# Patient Record
Sex: Male | Born: 1935 | Race: White | Hispanic: No | Smoking: Never smoker
Health system: Southern US, Community
[De-identification: ages and names within clinical notes are randomized; demographics above are authoritative.]

## PROBLEM LIST (undated history)

## (undated) DIAGNOSIS — H9190 Unspecified hearing loss, unspecified ear: Secondary | ICD-10-CM

## (undated) DIAGNOSIS — C61 Malignant neoplasm of prostate: Secondary | ICD-10-CM

## (undated) DIAGNOSIS — F0390 Unspecified dementia without behavioral disturbance: Secondary | ICD-10-CM

## (undated) DIAGNOSIS — E7800 Pure hypercholesterolemia, unspecified: Secondary | ICD-10-CM

## (undated) DIAGNOSIS — R42 Dizziness and giddiness: Secondary | ICD-10-CM

## (undated) DIAGNOSIS — J189 Pneumonia, unspecified organism: Secondary | ICD-10-CM

## (undated) DIAGNOSIS — I2510 Atherosclerotic heart disease of native coronary artery without angina pectoris: Secondary | ICD-10-CM

## (undated) DIAGNOSIS — M1711 Unilateral primary osteoarthritis, right knee: Secondary | ICD-10-CM

## (undated) DIAGNOSIS — I639 Cerebral infarction, unspecified: Secondary | ICD-10-CM

## (undated) DIAGNOSIS — K219 Gastro-esophageal reflux disease without esophagitis: Secondary | ICD-10-CM

## (undated) HISTORY — PX: BREAST BIOPSY: SHX20

## (undated) HISTORY — PX: OTHER SURGICAL HISTORY: SHX169

## (undated) HISTORY — PX: APPENDECTOMY: SHX54

## (undated) HISTORY — PX: INGUINAL HERNIA REPAIR: SUR1180

## (undated) HISTORY — PX: TONSILLECTOMY: SUR1361

## (undated) HISTORY — DX: Unilateral primary osteoarthritis, right knee: M17.11

## (undated) HISTORY — DX: Pneumonia, unspecified organism: J18.9

## (undated) HISTORY — DX: Malignant neoplasm of prostate: C61

## (undated) HISTORY — DX: Unspecified hearing loss, unspecified ear: H91.90

---

## 1991-06-11 DIAGNOSIS — C61 Malignant neoplasm of prostate: Secondary | ICD-10-CM

## 1991-06-11 HISTORY — PX: PROSTATECTOMY: SHX69

## 1991-06-11 HISTORY — DX: Malignant neoplasm of prostate: C61

## 1993-08-08 DIAGNOSIS — J189 Pneumonia, unspecified organism: Secondary | ICD-10-CM

## 1993-08-08 HISTORY — DX: Pneumonia, unspecified organism: J18.9

## 1994-02-08 DIAGNOSIS — I2510 Atherosclerotic heart disease of native coronary artery without angina pectoris: Secondary | ICD-10-CM

## 1994-02-08 HISTORY — DX: Atherosclerotic heart disease of native coronary artery without angina pectoris: I25.10

## 2001-05-13 ENCOUNTER — Encounter: Payer: Self-pay | Admitting: Surgery

## 2001-05-13 ENCOUNTER — Encounter: Admission: RE | Admit: 2001-05-13 | Discharge: 2001-05-13 | Payer: Self-pay | Admitting: Surgery

## 2001-05-15 ENCOUNTER — Ambulatory Visit (HOSPITAL_BASED_OUTPATIENT_CLINIC_OR_DEPARTMENT_OTHER): Admission: RE | Admit: 2001-05-15 | Discharge: 2001-05-16 | Payer: Self-pay | Admitting: Surgery

## 2001-05-15 ENCOUNTER — Encounter (INDEPENDENT_AMBULATORY_CARE_PROVIDER_SITE_OTHER): Payer: Self-pay | Admitting: Specialist

## 2002-06-10 DIAGNOSIS — I639 Cerebral infarction, unspecified: Secondary | ICD-10-CM

## 2002-06-10 HISTORY — DX: Cerebral infarction, unspecified: I63.9

## 2003-01-17 ENCOUNTER — Encounter: Admission: RE | Admit: 2003-01-17 | Discharge: 2003-01-17 | Payer: Self-pay | Admitting: Internal Medicine

## 2003-01-17 ENCOUNTER — Encounter: Payer: Self-pay | Admitting: Internal Medicine

## 2003-01-21 ENCOUNTER — Encounter: Payer: Self-pay | Admitting: Internal Medicine

## 2003-01-21 ENCOUNTER — Ambulatory Visit (HOSPITAL_COMMUNITY): Admission: RE | Admit: 2003-01-21 | Discharge: 2003-01-21 | Payer: Self-pay | Admitting: Internal Medicine

## 2009-04-18 ENCOUNTER — Encounter: Admission: RE | Admit: 2009-04-18 | Discharge: 2009-04-18 | Payer: Self-pay | Admitting: Orthopedic Surgery

## 2010-10-26 NOTE — Op Note (Signed)
[place]  Patient:    [facility], PROBERT MD Visit Number: [id number] MRN: [id number]          Service Type: DSU Location: [facility] Attending Physician:  Katha Cabal Dictated by:   [place] Daphine Deutscher, M.D. Proc. Date: 05/15/01 Admit Date:  05/15/2001                             Operative Report  CCS# [id number]  PREOPERATIVE DIAGNOSIS:  Right inguinal hernia.  POSTOPERATIVE DIAGNOSIS:  Right indirect inguinal hernia.  OPERATION PERFORMED:  SURGEON:  Matthew B. Daphine Deutscher, M.D.  ASSISTANT:  ANESTHESIA:  MAC.  DISPOSITION:  To [facility].  DESCRIPTION OF PROCEDURE:  This 77 year old gentleman was taken to room 6 at [facility] and given intravenous sedation.  He got Ancef preop.  The right inguinal region was shaved and prepped with Betadine and draped sterilely.  Regional anesthesia was administered with injection into the anterior superior iliac spine.  Also along an oblique incision in the right inguinal region.  Incision was made and the external oblique was identified and incised.  This enabled me to mobilize the cord structures which I got around distally.  Proximally, I went through the cremasteric fibers and found an indirect sac which I dissected free from the surrounding tissue and did a high ligation using a 2-0 silk after twisting the sac off.  The floor was then reinforced with a piece of mesh which was cut to fit and sutured along the inguinal ligament with a running 2-0 Prolene.  Medially it was sutured with a running 2-0 Prolene and then the two edges were sutured together with a horizontal mattress suture and 2-0 Prolene.  This was tucked beneath the external oblique which was then closed with running 2-0 Vicryl. 4-0 Vicryl was used subcutaneously and subcuticularly to approximate the skin. Final closure was done with a 5-0 Vicryl subcuticular and benzoin and Steri-Strips.  The  patient seemed to tolerate the procedure well.  He will be admitted to the [facility] overnight. Dictated by:   Th[place] Park Daphine Deutscher, M.D. Attending Physician:  Katha Cabal DD:  05/15/01 TD:  05/15/01 Job: [id number]

## 2010-11-06 ENCOUNTER — Ambulatory Visit
Admission: RE | Admit: 2010-11-06 | Discharge: 2010-11-06 | Disposition: A | Discharge status: Home / Self Care | Payer: Medicare Other | Source: Ambulatory Visit | Attending: Internal Medicine | Admitting: Internal Medicine

## 2010-11-06 ENCOUNTER — Other Ambulatory Visit: Payer: Self-pay | Admitting: Internal Medicine

## 2010-11-06 DIAGNOSIS — M79669 Pain in unspecified lower leg: Secondary | ICD-10-CM

## 2011-07-16 ENCOUNTER — Other Ambulatory Visit: Payer: Self-pay | Admitting: Physician Assistant

## 2012-03-26 IMAGING — US US EXTREM LOW VENOUS*R*
1 series · 14 of 24 positions shown · non-contrast
Comparison: None.

CLINICAL DATA: Right calf pain

RIGHT LOWER EXTREMITY VENOUS DUPLEX ULTRASOUND
TECHNIQUE: Gray-scale sonography with graded compression, as well
as color Doppler and duplex ultrasound were performed to evaluate
the deep venous system of the lower extremity from the level of the
common femoral vein through the popliteal and proximal calf veins.
Spectral Doppler was utilized to evaluate flow at rest and with
distal augmentation maneuvers.

[Series 1: us extrem low venous*right* · 14 of 36 slices shown]
[im 1/36]
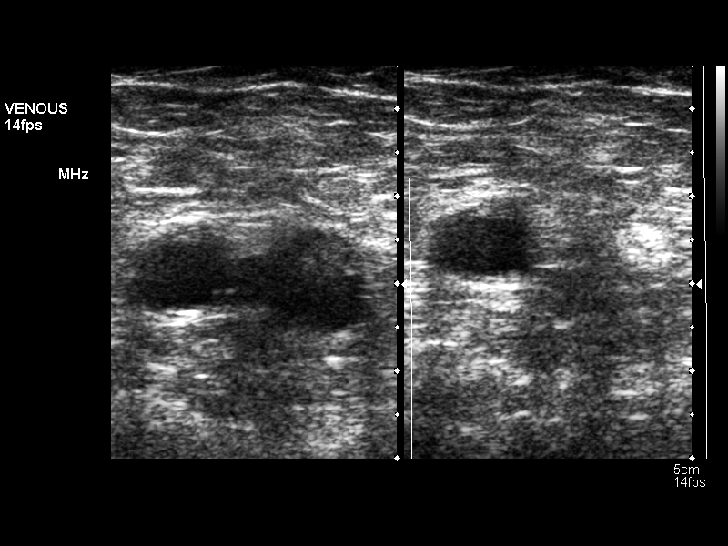
[im 4/36]
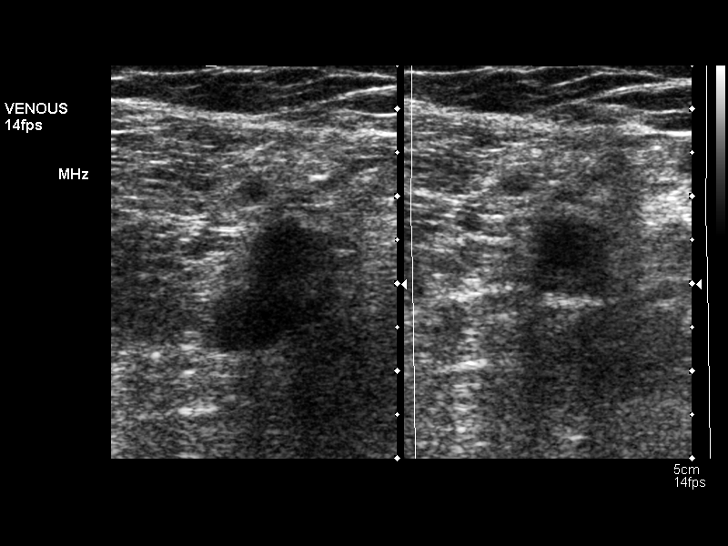
[im 7/36]
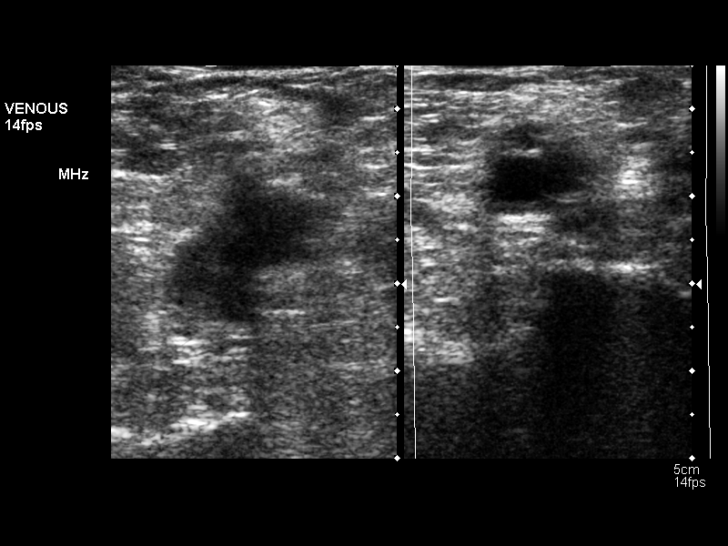
[im 10/36]
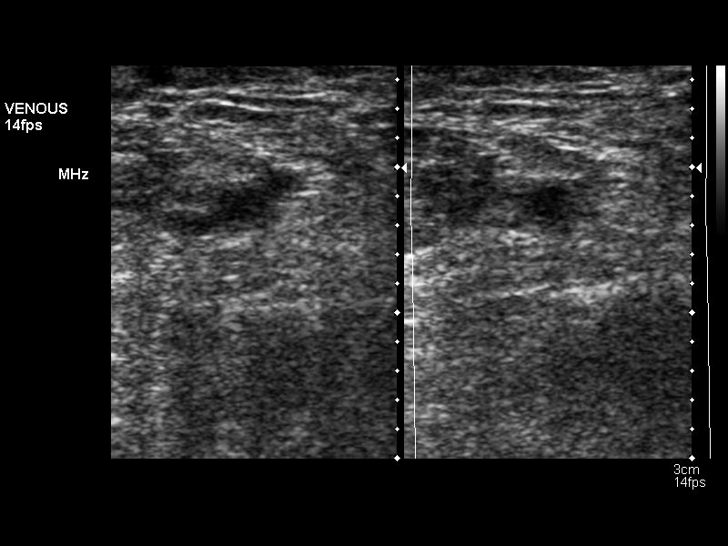
[im 11/36]
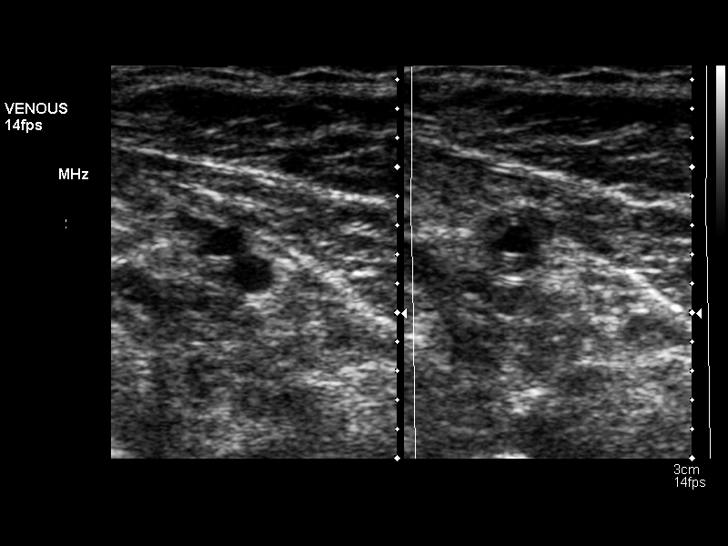
[im 14/36]
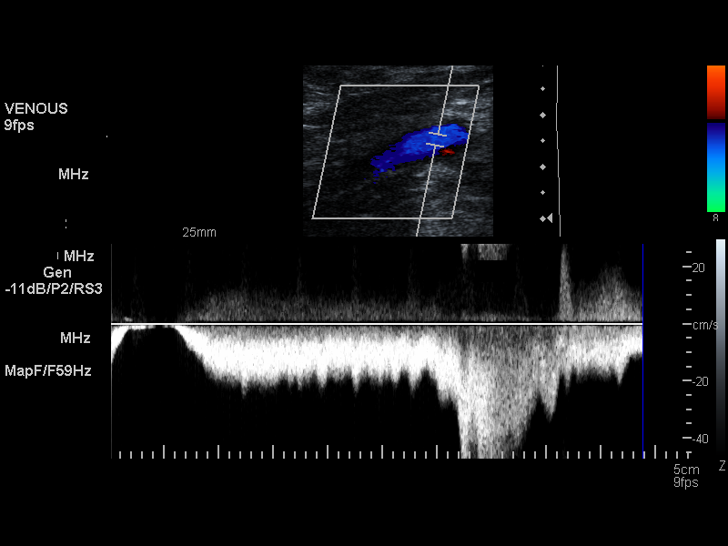
[im 17/36]
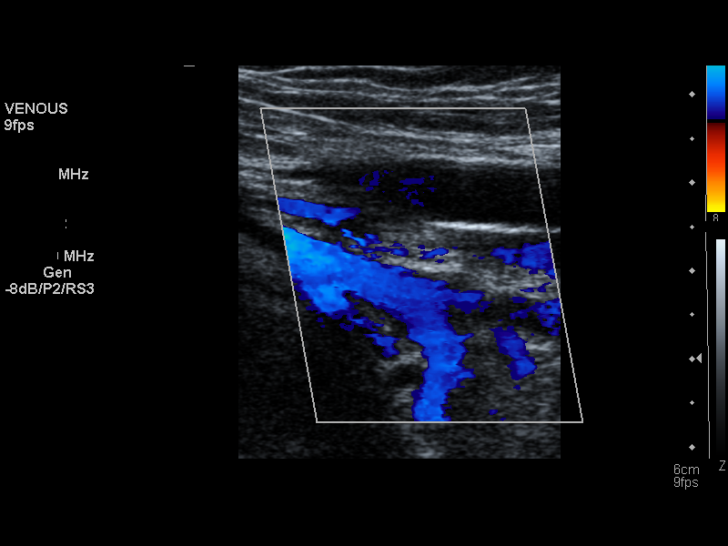
[im 19/36]
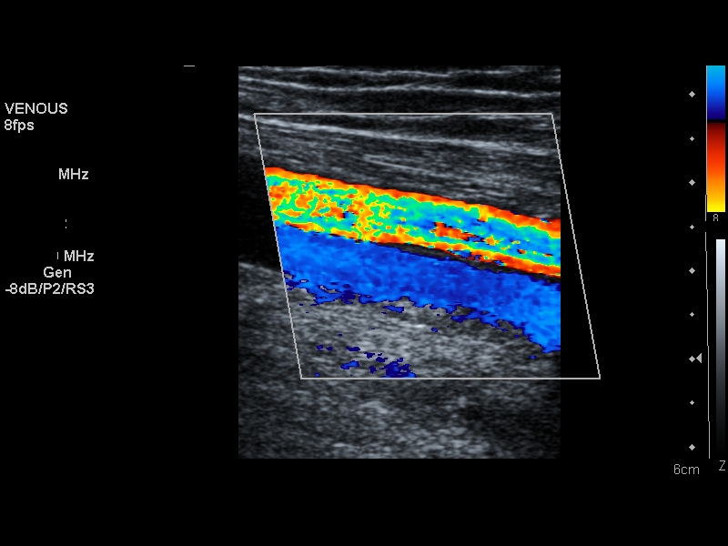
[im 22/36]
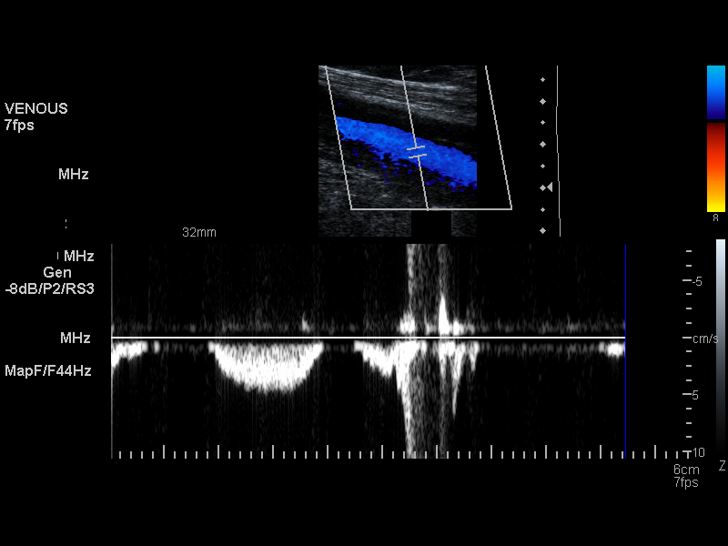
[im 25/36]
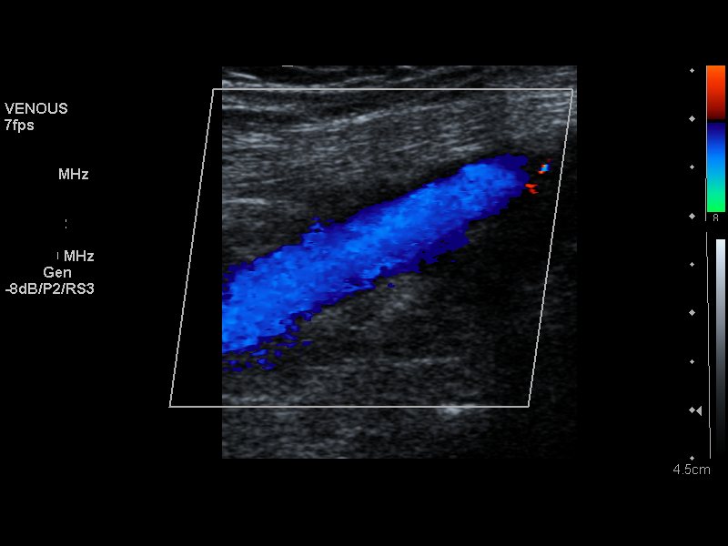
[im 28/36]
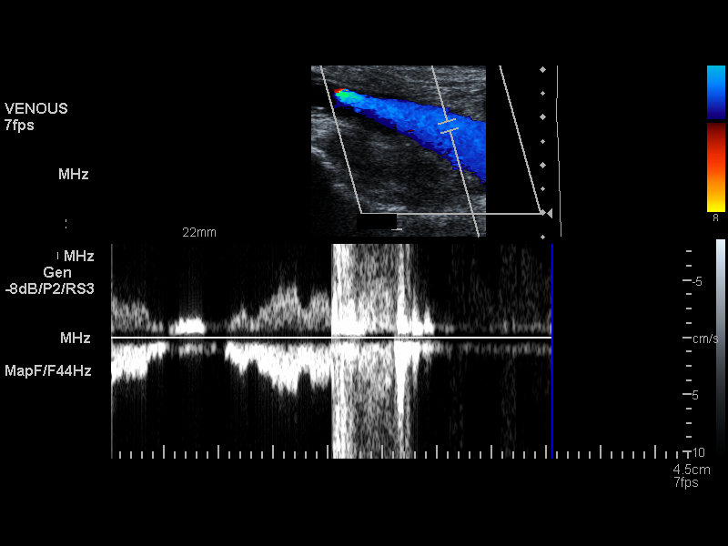
[im 29/36]
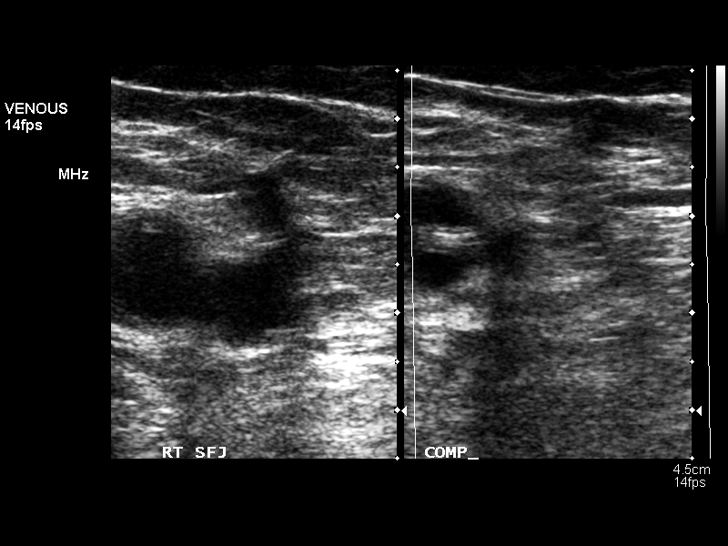
[im 32/36]
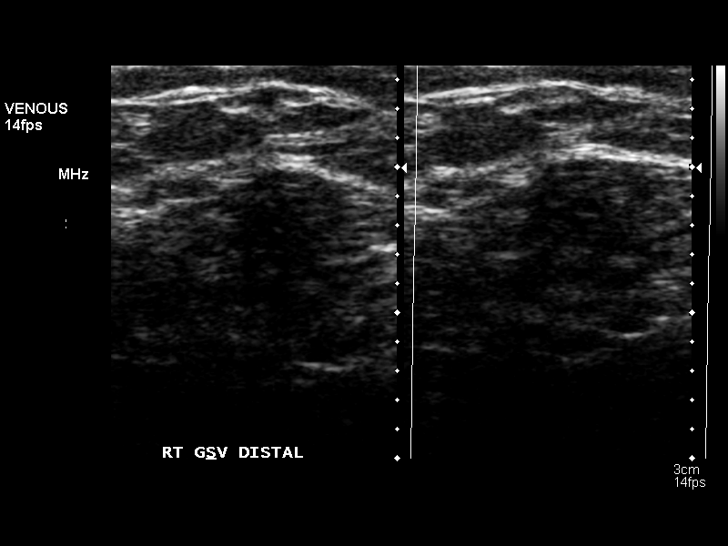
[im 36/36]
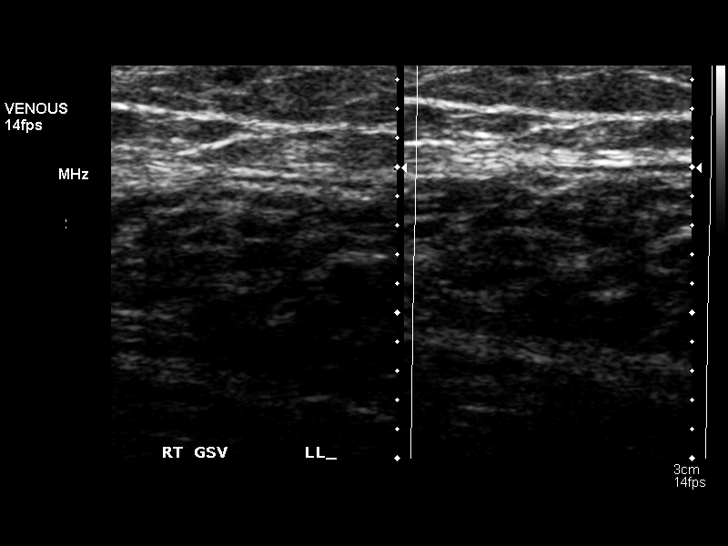

[14 of 24 positions shown; findings below may reference images not displayed]

FINDINGS: Normal compressibility of the common femoral,
superficial femoral, and popliteal veins is demonstrated, as well
as the visualized proximal calf veins.  No filling defects to
suggest DVT on grayscale or color Doppler imaging.  Doppler
waveforms show normal direction of venous flow, normal respiratory
phasicity and response to augmentation.
IMPRESSION: No evidence of lower extremity deep vein thrombosis.

## 2014-10-09 ENCOUNTER — Emergency Department (HOSPITAL_COMMUNITY): Payer: Medicare Other

## 2014-10-09 ENCOUNTER — Inpatient Hospital Stay (HOSPITAL_COMMUNITY)
Admission: EM | Admit: 2014-10-09 | Discharge: 2014-10-10 | DRG: 392 | Disposition: A | Discharge status: Skilled Nursing Facility | Payer: Medicare Other | Attending: Internal Medicine | Admitting: Internal Medicine

## 2014-10-09 ENCOUNTER — Encounter (HOSPITAL_COMMUNITY): Payer: Self-pay | Admitting: Emergency Medicine

## 2014-10-09 ENCOUNTER — Observation Stay (HOSPITAL_COMMUNITY): Payer: Medicare Other

## 2014-10-09 DIAGNOSIS — N183 Chronic kidney disease, stage 3 unspecified: Secondary | ICD-10-CM | POA: Diagnosis present

## 2014-10-09 DIAGNOSIS — I447 Left bundle-branch block, unspecified: Secondary | ICD-10-CM | POA: Diagnosis present

## 2014-10-09 DIAGNOSIS — R1013 Epigastric pain: Secondary | ICD-10-CM | POA: Diagnosis not present

## 2014-10-09 DIAGNOSIS — R0789 Other chest pain: Secondary | ICD-10-CM | POA: Diagnosis not present

## 2014-10-09 DIAGNOSIS — K219 Gastro-esophageal reflux disease without esophagitis: Secondary | ICD-10-CM | POA: Diagnosis not present

## 2014-10-09 DIAGNOSIS — F0390 Unspecified dementia without behavioral disturbance: Secondary | ICD-10-CM | POA: Diagnosis not present

## 2014-10-09 DIAGNOSIS — F03A0 Unspecified dementia, mild, without behavioral disturbance, psychotic disturbance, mood disturbance, and anxiety: Secondary | ICD-10-CM | POA: Diagnosis present

## 2014-10-09 DIAGNOSIS — E780 Pure hypercholesterolemia: Secondary | ICD-10-CM | POA: Diagnosis present

## 2014-10-09 DIAGNOSIS — Z8673 Personal history of transient ischemic attack (TIA), and cerebral infarction without residual deficits: Secondary | ICD-10-CM

## 2014-10-09 DIAGNOSIS — I2510 Atherosclerotic heart disease of native coronary artery without angina pectoris: Secondary | ICD-10-CM | POA: Diagnosis present

## 2014-10-09 DIAGNOSIS — R079 Chest pain, unspecified: Secondary | ICD-10-CM | POA: Diagnosis present

## 2014-10-09 DIAGNOSIS — Z66 Do not resuscitate: Secondary | ICD-10-CM | POA: Diagnosis present

## 2014-10-09 HISTORY — DX: Atherosclerotic heart disease of native coronary artery without angina pectoris: I25.10

## 2014-10-09 HISTORY — DX: Cerebral infarction, unspecified: I63.9

## 2014-10-09 HISTORY — DX: Gastro-esophageal reflux disease without esophagitis: K21.9

## 2014-10-09 HISTORY — DX: Dizziness and giddiness: R42

## 2014-10-09 HISTORY — DX: Pure hypercholesterolemia, unspecified: E78.00

## 2014-10-09 HISTORY — DX: Unspecified dementia, unspecified severity, without behavioral disturbance, psychotic disturbance, mood disturbance, and anxiety: F03.90

## 2014-10-09 LAB — BASIC METABOLIC PANEL
Anion gap: 8 (ref 5–15)
BUN: 19 mg/dL (ref 6–20)
CALCIUM: 9.3 mg/dL (ref 8.9–10.3)
CO2: 27 mmol/L (ref 22–32)
Chloride: 106 mmol/L (ref 101–111)
Creatinine, Ser: 1.67 mg/dL — ABNORMAL HIGH (ref 0.61–1.24)
GFR calc Af Amer: 38 mL/min — ABNORMAL LOW (ref >60)
GFR calc non Af Amer: 33 mL/min — ABNORMAL LOW (ref >60)
GLUCOSE: 143 mg/dL — AB (ref 70–99)
Potassium: 4.6 mmol/L (ref 3.5–5.1)
Sodium: 141 mmol/L (ref 135–145)

## 2014-10-09 LAB — CBC
HEMATOCRIT: 41.4 % (ref 39.0–52.0)
Hemoglobin: 14.3 g/dL (ref 13.0–17.0)
MCH: 31 pg (ref 26.0–34.0)
MCHC: 34.5 g/dL (ref 30.0–36.0)
MCV: 89.8 fL (ref 78.0–100.0)
Platelets: 214 10*3/uL (ref 150–400)
RBC: 4.61 MIL/uL (ref 4.22–5.81)
RDW: 14.1 % (ref 11.5–15.5)
WBC: 7 10*3/uL (ref 4.0–10.5)

## 2014-10-09 LAB — I-STAT TROPONIN, ED: TROPONIN I, POC: 0 ng/mL (ref 0.00–0.08)

## 2014-10-09 LAB — TROPONIN I: Troponin I: <0.03 ng/mL (ref <0.031)

## 2014-10-09 MED ORDER — ACETAMINOPHEN 325 MG PO TABS: 650.0000 mg | ORAL_TABLET | ORAL | Status: DC | PRN

## 2014-10-09 MED ORDER — ONDANSETRON HCL 4 MG/2ML IJ SOLN: 4.0000 mg | Freq: Four times a day (QID) | INTRAMUSCULAR | Status: DC | PRN

## 2014-10-09 MED ORDER — GI COCKTAIL ~~LOC~~: 30.0000 mL | Freq: Four times a day (QID) | ORAL | Status: DC | PRN

## 2014-10-09 MED ORDER — PANTOPRAZOLE SODIUM 40 MG PO TBEC
40.0000 mg | DELAYED_RELEASE_TABLET | Freq: Every day | ORAL | Status: DC
  Administered 2014-10-09 – 2014-10-10 (×2): 40 mg via ORAL
  Filled 2014-10-09 (×2): qty 1

## 2014-10-09 MED ORDER — SODIUM CHLORIDE 0.9 % IV SOLN
INTRAVENOUS | Status: DC
  Administered 2014-10-09: 16:00:00 via INTRAVENOUS

## 2014-10-09 MED ORDER — ENOXAPARIN SODIUM 30 MG/0.3ML ~~LOC~~ SOLN: 30.0000 mg | SUBCUTANEOUS | Status: DC

## 2014-10-09 MED ORDER — ASPIRIN EC 325 MG PO TBEC
325.0000 mg | DELAYED_RELEASE_TABLET | Freq: Every day | ORAL | Status: DC
  Administered 2014-10-10: 325 mg via ORAL
  Filled 2014-10-09: qty 1

## 2014-10-09 NOTE — ED Provider Notes (Signed)
CSN: [id number]     Arrival date & time 10/09/14  1320 History   First MD Initiated Contact with Patient 10/09/14 1321     Chief Complaint  Patient presents with  . Chest Pain    Level V caveat due to dementia (Consider location/radiation/quality/duration/timing/severity/associated sxs/prior Treatment) Patient is a 81 y.o. male presenting with chest pain. The history is provided by the patient.  Chest Pain Associated symptoms: nausea   Associated symptoms: no numbness, no shortness of breath and no weakness    patient presents with chest pain. Reportedly was eating and began to have chest pain and was diaphoretic and nauseous. He is pain-free now. Reportedly had nitroglycerin and feels better after 2 nitros and some Zofran. Does have previous history of coronary artery disease. Reportedly refused a heart catheterization 20 years ago. Patient has some dementia and is unable to give a great history.  Past Medical History  Diagnosis Date  . Stroke   . Coronary artery disease   . Dementia   . GERD (gastroesophageal reflux disease)   . Hypercholesteremia   . Vertigo    Past Surgical History  Procedure Laterality Date  . Prostatectomy    . Appendectomy    . Tonsillectomy     No family history on file. History  Substance Use Topics  . Smoking status: Never Smoker   . Smokeless tobacco: Not on file  . Alcohol Use: No    Review of Systems  Unable to perform ROS Constitutional: Negative for activity change.  Respiratory: Negative for shortness of breath.   Cardiovascular: Positive for chest pain. Negative for leg swelling.  Gastrointestinal: Positive for nausea.  Genitourinary: Negative for flank pain.  Neurological: Negative for weakness and numbness.      Allergies  Review of patient's allergies indicates no known allergies.  Home Medications   Prior to Admission medications   Medication Sig Start Date End Date Taking? Authorizing Provider  acetaminophen (TYLENOL) 325  MG tablet Take 650 mg by mouth 3 (three) times daily as needed for mild pain.   Yes Historical Provider, MD  Multiple Vitamins-Minerals (CERTAVITE SENIOR/ANTIOXIDANT) TABS Take 1 tablet by mouth every evening.   Yes Historical Provider, MD  nitroGLYCERIN (NITROSTAT) 0.4 MG SL tablet Place 0.4 mg under the tongue every 5 (five) minutes as needed for chest pain.   Yes Historical Provider, MD  polyethylene glycol (MIRALAX / GLYCOLAX) packet Take 17 g by mouth every Monday, Wednesday, and Friday.   Yes Historical Provider, MD  senna-docusate (SENOKOT-S) 8.6-50 MG per tablet Take 1-2 tablets by mouth every evening. Take 2 if not effective   Yes Historical Provider, MD   BP 139/79 mmHg  Pulse 78  Temp(Src) 97.5 F (36.4 C) (Oral)  Resp 13  SpO2 95% Physical Exam  Constitutional: He appears well-developed and well-nourished.  HENT:  Head: Normocephalic and atraumatic.  Eyes: Pupils are equal, round, and reactive to light.  Neck: Neck supple. No JVD present.  Cardiovascular: Normal rate, regular rhythm and normal heart sounds.   No murmur heard. Pulmonary/Chest: Effort normal and breath sounds normal.  Abdominal: Soft. Bowel sounds are normal. He exhibits no distension. There is no tenderness.  Musculoskeletal: Normal range of motion. He exhibits no edema.  Neurological: He is alert. No cranial nerve deficit.  Pleasant with mild confusion  Skin: Skin is warm and dry.  Psychiatric: He has a normal mood and affect.  Nursing note and vitals reviewed.   ED Course  Procedures (including critical care time)  Labs Review Labs Reviewed  CBC  BASIC METABOLIC PANEL  I-STAT [place], ED    Imaging Review No results found.   EKG Interpretation   Date/Time:  Sunday Oct 09 2014 13:27:27 EDT Ventricular Rate:  80 PR Interval:  258 QRS Duration: 150 QT Interval:  427 QTC Calculation: 493 R Axis:   -66 Text Interpretation:  Sinus rhythm Prolonged PR interval Probable left  atrial  enlargement Left bundle branch block Confirmed by Alvino Chapel  MD,  Ovid Curd [phone]) on 10/09/2014 1:34:09 PM      MDM   Final diagnoses:  None    Patient with chest pain. EKG shows left bundle. Initial troponin negative, however patient has apparent known coronary artery disease and did have chest pain with diaphoresis. Will admit to internal medicine.    Davonna Belling, MD 10/09/14 [phone]

## 2014-10-09 NOTE — ED Notes (Signed)
Per [facility], pt from "[facility]". Had sudden onset of Chest pressure, sub sternal. Pt extremely diaphoretic and vomiting. Staff had given 1 nitro, with no change in pain. Pt given another nitro by EMS and 324 asa and 4 zofran and pain had subsided. Upon arrival, pt in NAD, denies pain, Alert, minor dementia. denies nausea. EKG shows LBBB, unsure of previous EKG for comparison.

## 2014-10-09 NOTE — ED Notes (Signed)
Appointment time @ 16:20

## 2014-10-09 NOTE — H&P (Signed)
[facility] History and Physical                                                                                    Jacob Hooper, is a 81 y.o. male  MRN: [id number]   DOB - May 11, 1916  Admit Date - 10/09/2014  Outpatient Primary MD for the patient is Jacob Screws, MD  Referring MD: Dr. Ileana Hooper  With History of -  Past Medical History  Diagnosis Date  . Stroke   . Coronary artery disease   . Dementia   . GERD (gastroesophageal reflux disease)   . Hypercholesteremia   . Vertigo       Past Surgical History  Procedure Laterality Date  . Prostatectomy    . Appendectomy    . Tonsillectomy      in for   Chief Complaint  Patient presents with  . Chest Pain     HPI This is a 81 year old male patient [facility] who was sent to the ER today with complaints of acute solitary episode of substernal chest pain. History was obtained both from the ER record and from the patient and his daughter who was at the bedside. Patient was in his usual state of health and during lunch today at the nursing facility in the dining room the patient developed midsternal chest discomfort was later followed by diaphoresis. He was given sublingual nitroglycerin by the staff at the nursing facility but it was unclear if he actually received any relief from this medication. EMS arrived to the facility and transported the [facility] to [facility]. Given an additional nitroglycerin en route as well as Zofran and by the time he arrived to [facility] he was chest pain-free. Daughter estimates duration of chest pain was about 30 minutes total. Patient has an apparent history of medically treated CAD dating back to 1995 when he underwent a treadmill stress test at [facility]. A cardiac catheterization was recommended at that time that the patient refused.  Upon arrival to the ER the patient was afebrile and hemodynamically stable. His EKG showed a left bundle  branch block. Unfortunately we do not have prior EKGs for comparison. Troponin was normal. He has remained chest pain-free. Two-view chest x-ray was unremarkable. Further history obtained reveals that patient is very active at the nursing facility he is quite ambulatory and actually dissipates and an exercise group once to twice weekly and has not had any of these type symptoms of today such as chest pain shortness of breath diaphoresis with activity. His daughter also reports that he does have a history of recurrent belching and seemed to have more hiccups than usual over the past several days.   Review of Systems   In addition to the HPI above,  No Fever-chills, myalgias or other constitutional symptoms No Headache, changes with Vision or hearing, new weakness, tingling, numbness in any extremity, No problems swallowing food or Liquids, indigestion/reflux No Cough or Shortness of Breath, palpitations, orthopnea or DOE No Abdominal pain, N/V; no melena or hematochezia, no dark tarry stools, Bowel movements are regular, No dysuria, hematuria or flank pain No new skin rashes, lesions,  masses or bruises, No new joints pains-aches No recent weight gain or loss No polyuria, polydypsia or polyphagia,  *A full 10 point Review of Systems was done, except as stated above, all other Review of Systems were negative.  Social History History  Substance Use Topics  . Smoking status: Never Smoker   . Smokeless tobacco: Not on file  . Alcohol Use: No    [facility]ides at: [facility]  Lives with: Not applicable  Ambulatory status: Ambulatory without assistive devices prior to admission  Family History: No significant family history obtained that would be relevant to current diagnosis; patient also of significantly advanced age    Prior to Admission medications   Medication Sig Start Date End Date Taking? Authorizing Provider  acetaminophen (TYLENOL) 325 MG tablet Take 650 mg by  mouth 3 (three) times daily as needed for mild pain.   Yes Historical Provider, MD  Multiple Vitamins-Minerals (CERTAVITE SENIOR/ANTIOXIDANT) TABS Take 1 tablet by mouth every evening.   Yes Historical Provider, MD  nitroGLYCERIN (NITROSTAT) 0.4 MG SL tablet Place 0.4 mg under the tongue every 5 (five) minutes as needed for chest pain.   Yes Historical Provider, MD  polyethylene glycol (MIRALAX / GLYCOLAX) packet Take 17 g by mouth every Monday, Wednesday, and Friday.   Yes Historical Provider, MD  senna-docusate (SENOKOT-S) 8.6-50 MG per tablet Take 1-2 tablets by mouth every evening. Take 2 if not effective   Yes Historical Provider, MD    No Known Allergies  Physical Exam  Vitals  Blood pressure 118/63, pulse 78, temperature 97.5 F (36.4 C), temperature source Oral, resp. rate 14, SpO2 98 %.   General:  In no acute distress, appears healthy and well nourished  Psych:  Normal affect, Denies Suicidal or Homicidal ideations, Awake Alert, Oriented X 3. Speech and thought patterns are clear and appropriate  Neuro:   No focal neurological deficits, CN II through XII intact, Strength 5/5 all 4 extremities, Sensation intact all 4 extremities.  ENT:  Ears and Eyes appear Normal, Conjunctivae clear, PER. Moist oral mucosa without erythema or exudates.  Neck:  Supple, No lymphadenopathy appreciated  Respiratory:  Symmetrical chest wall movement, Good air movement bilaterally, CTAB. Room Air  Cardiac:  RRR, No Murmurs, no LE edema noted, no JVD, No carotid bruits, peripheral pulses palpable at 2+  Abdomen:  Positive bowel sounds, Soft, Non tender, Non distended,  No masses appreciated, no obvious hepatosplenomegaly  Skin:  No Cyanosis, Normal Skin Turgor, No Skin Rash or Bruise.  Extremities: Symmetrical without obvious trauma or injury,  no effusions.  Data Review  CBC  Recent Labs Lab 10/09/14 1341  WBC 7.0  HGB 14.3  HCT 41.4  PLT 214  MCV 89.8  MCH 31.0  MCHC 34.5  RDW  14.1    Chemistries   Recent Labs Lab 10/09/14 1341  NA 141  K 4.6  CL 106  CO2 27  GLUCOSE 143*  BUN 19  CREATININE 1.67*  CALCIUM 9.3    CrCl cannot be calculated (Unknown ideal weight.).  No results for input(s): TSH, T4TOTAL, T3FREE, THYROIDAB in the last 72 hours.  Invalid input(s): FREET3  Coagulation profile No results for input(s): INR, PROTIME in the last 168 hours.  No results for input(s): DDIMER in the last 72 hours.  Cardiac Enzymes No results for input(s): CKMB, TROPONINI, MYOGLOBIN in the last 168 hours.  Invalid input(s): CK  Invalid input(s): POCBNP  Urinalysis No results found for: COLORURINE, APPEARANCEUR, [place], [place],  [place], BILIRUBINUR, KETONESUR, PROTEINUR, UROBILINOGEN, NITRITE, LEUKOCYTESUR  Imaging results:   Dg Chest 2 View  10/09/2014   CLINICAL DATA:  81 year old male with a history of general chest pain. History of stroke and coronary artery disease  EXAM: CHEST - 2 VIEW  COMPARISON:  10/28/2011, 10/29/2010, prior CT 04/18/2009  FINDINGS: Cardiomediastinal silhouette unchanged in size and contour. Tortuosity of the thoracic aorta again noted. Atherosclerotic calcifications of the aortic arch.  Stigmata of emphysema, with increased retrosternal airspace, flattened hemidiaphragms, increased AP diameter, and hyperinflation on the AP view.  No confluent airspace disease. No pulmonary vascular congestion. No pneumothorax or pleural effusion.  Chronic lung markings.  No displaced fracture.  Degenerative changes of the spine.  Unremarkable appearance of the upper abdomen.  IMPRESSION: No radiographic evidence of acute cardiopulmonary disease with background chronic changes.  Atherosclerosis.  Emphysema.  Signed,  Dulcy Fanny. Earleen Newport, DO  [facility]  [facility]   Electronically Signed   By: Corrie Mckusick D.O.   On: 10/09/2014 14:45     EKG: (Independently reviewed) left bundle branch block,  sinus rhythm with prolonged PR interval and borderline prolonged QTC 493 ms   Assessment & Plan  Principal Problem:   Atypical chest pain/ LBBB -Admit to telemetry -Heart score equals 3-4 -Low index of suspicion this is cardiac ischemic in nature; discussed with patient and daughter that given severity of chronic kidney disease and patient's advanced age and thus he had significant life-threatening symptoms concerning for cardiac ischemia that cardiac catheterization and likely would not be pursued-verbalized understanding -Cycle cardiac enzymes -Check 2-D echocardiogram -Begin aspirin -Not on beta blockers or aspirin prior to admission -Check fasting lipid panel-given his advanced age and less markedly abnormal would not treat mild abnormalities and lipids  Active Problems:   GERD  -Suspect current chest pain likely GI in etiology -Begin Protonix -Check abdominal ultrasound; IV fluids while nothing by mouth    CKD (chronic kidney disease), stage III -GFR in the 30s -Poor candidate for elective cardiac catheterization    Mild dementia -Resides at nursing facility -Not on medications prior to admission    DVT Prophylaxis: Lovenox renally dose adjusted  Family Communication: Daughter at bedside    Code Status:  DO NOT RESUSCITATE  Condition:  Stable  Discharge disposition: Anticipate return to nursing facility tomorrow after evaluation completed  Time spent in minutes : 60   Lenell Mcconnell L. ANP on 10/09/2014 at 3:52 PM  Between 7am to 7pm - Pager - [phone]  After 7pm go to [email or web address] - password [email or web address]  And look for the night coverage person covering me after hours  [facility]

## 2014-10-09 NOTE — ED Notes (Signed)
Pt to ultrasound at this time.

## 2014-10-10 DIAGNOSIS — I447 Left bundle-branch block, unspecified: Secondary | ICD-10-CM

## 2014-10-10 DIAGNOSIS — R0789 Other chest pain: Secondary | ICD-10-CM

## 2014-10-10 LAB — LIPID PANEL
Cholesterol: 221 mg/dL — ABNORMAL HIGH (ref 0–200)
HDL: 34 mg/dL — AB (ref >40)
LDL Cholesterol: 163 mg/dL — ABNORMAL HIGH (ref 0–99)
TRIGLYCERIDES: 121 mg/dL (ref <150)
Total CHOL/HDL Ratio: 6.5 RATIO
VLDL: 24 mg/dL (ref 0–40)

## 2014-10-10 LAB — TROPONIN I
Troponin I: <0.03 ng/mL (ref <0.031)
Troponin I: <0.03 ng/mL (ref <0.031)

## 2014-10-10 MED ORDER — GI COCKTAIL ~~LOC~~: 30.0000 mL | Freq: Four times a day (QID) | ORAL | Status: AC | PRN

## 2014-10-10 MED ORDER — ASPIRIN 81 MG PO TBEC: 81.0000 mg | DELAYED_RELEASE_TABLET | Freq: Every day | ORAL | Status: DC

## 2014-10-10 MED ORDER — PANTOPRAZOLE SODIUM 40 MG PO TBEC: 40.0000 mg | DELAYED_RELEASE_TABLET | Freq: Every day | ORAL | Status: DC

## 2014-10-10 NOTE — Discharge Summary (Signed)
PATIENT DETAILS Name: Jacob Hooper Age: 81 y.o. Sex: male Date of Birth: May 13, 1916 MRN: [id number]. Admitting Physician: Annita Brod, MD BUL:AGTXM,IWOEHO NEVILL, MD  Admit Date: 10/09/2014 Discharge date: 10/10/2014  Recommendations for Outpatient Follow-up:  1. Routine age appropriate health maintenance  2. Recheck BMETin next 1-2 weeks-has mildly elevated Creatinine-not sure what baseline is  PRIMARY DISCHARGE DIAGNOSIS:  Principal Problem:   Atypical chest pain Active Problems:   LBBB (left bundle branch block)   GERD (gastroesophageal reflux disease)   Mild dementia   CKD (chronic kidney disease), stage III   Chest pain      PAST MEDICAL HISTORY: Past Medical History  Diagnosis Date  . Stroke   . Coronary artery disease   . Dementia   . GERD (gastroesophageal reflux disease)   . Hypercholesteremia   . Vertigo     DISCHARGE MEDICATIONS: Current Discharge Medication List    START taking these medications   Details  Alum & Mag Hydroxide-Simeth (GI COCKTAIL) SUSP suspension Take 30 mLs by mouth 4 (four) times daily as needed for indigestion (or chest pain). Shake well.    aspirin EC 81 MG EC tablet Take 1 tablet (81 mg total) by mouth daily.    pantoprazole (PROTONIX) 40 MG tablet Take 1 tablet (40 mg total) by mouth daily. Qty: 30 tablet, Refills: 0      CONTINUE these medications which have NOT CHANGED   Details  acetaminophen (TYLENOL) 325 MG tablet Take 650 mg by mouth 3 (three) times daily as needed for mild pain.    Multiple Vitamins-Minerals (CERTAVITE SENIOR/ANTIOXIDANT) TABS Take 1 tablet by mouth every evening.    nitroGLYCERIN (NITROSTAT) 0.4 MG SL tablet Place 0.4 mg under the tongue every 5 (five) minutes as needed for chest pain.    polyethylene glycol (MIRALAX / GLYCOLAX) packet Take 17 g by mouth every Monday, Wednesday, and Friday.    senna-docusate (SENOKOT-S) 8.6-50 MG per tablet Take 1-2 tablets by mouth every evening. Take  2 if not effective        ALLERGIES:  No Known Allergies  BRIEF HPI:  See H&P, Labs, Consult and Test reports for all details in brief, patient was admitted for evaluation of chest pain  CONSULTATIONS:   cardiology  PERTINENT RADIOLOGIC STUDIES: Dg Chest 2 View  10/09/2014   CLINICAL DATA:  81 year old male with a history of general chest pain. History of stroke and coronary artery disease  EXAM: CHEST - 2 VIEW  COMPARISON:  10/28/2011, 10/29/2010, prior CT 04/18/2009  FINDINGS: Cardiomediastinal silhouette unchanged in size and contour. Tortuosity of the thoracic aorta again noted. Atherosclerotic calcifications of the aortic arch.  Stigmata of emphysema, with increased retrosternal airspace, flattened hemidiaphragms, increased AP diameter, and hyperinflation on the AP view.  No confluent airspace disease. No pulmonary vascular congestion. No pneumothorax or pleural effusion.  Chronic lung markings.  No displaced fracture.  Degenerative changes of the spine.  Unremarkable appearance of the upper abdomen.  IMPRESSION: No radiographic evidence of acute cardiopulmonary disease with background chronic changes.  Atherosclerosis.  Emphysema.  Signed,  Dulcy Fanny. Earleen Newport, DO  [facility]  [facility]   Electronically Signed   By: Corrie Mckusick D.O.   On: 10/09/2014 14:45   US Abdomen Complete  10/09/2014   CLINICAL DATA:  Epigastric abdominal pain  EXAM: ULTRASOUND ABDOMEN COMPLETE  COMPARISON:  None.  FINDINGS: Gallbladder: Contracted gallbladder. The patient ate several hr prior to imaging. Sonographic Murphy's sign absent.  Common bile duct: Diameter: 4 mm  Liver: No focal lesion identified. Within normal limits in parenchymal echogenicity.  IVC: No abnormality visualized.  Pancreas: Not seen satisfactorily due to overlying bowel gas.  Spleen: Size and appearance within normal limits.  Right Kidney: Length: 9.9 cm. Cortical thinning and prominence of renal  sinus adipose tissue.  Left Kidney: Length: 9.3 cm. Cortical thinning and prominence of renal sinus adipose tissue.  Abdominal aorta: No aneurysm visualized. Atherosclerotic calcification noted.  Other findings: None.  IMPRESSION: 1. Renal cortical atrophy, potentially with renal sinus lipomatosis. 2. Poor visualization of the pancreas and midline structures due to bowel gas. 3. Mildly contracted gallbladder due to patient meal several hr prior to imaging.   Electronically Signed   By: Van Clines M.D.   On: 10/09/2014 17:14     PERTINENT LAB RESULTS: CBC:  Recent Labs  10/09/14 1341  WBC 7.0  HGB 14.3  HCT 41.4  PLT 214   CMET CMP     Component Value Date/Time   NA 141 10/09/2014 1341   K 4.6 10/09/2014 1341   CL 106 10/09/2014 1341   CO2 27 10/09/2014 1341   GLUCOSE 143* 10/09/2014 1341   BUN 19 10/09/2014 1341   CREATININE 1.67* 10/09/2014 1341   CALCIUM 9.3 10/09/2014 1341   GFRNONAA 33* 10/09/2014 1341   GFRAA 38* 10/09/2014 1341    GFR Estimated Creatinine Clearance: 20.8 mL/min (by C-G formula based on Cr of 1.67). No results for input(s): LIPASE, AMYLASE in the last 72 hours.  Recent Labs  10/09/14 2200 10/10/14 0321  TROPONINI <0.03 <0.03   Invalid input(s): POCBNP No results for input(s): DDIMER in the last 72 hours. No results for input(s): HGBA1C in the last 72 hours.  Recent Labs  10/10/14 0321  CHOL 221*  HDL 34*  LDLCALC 163*  TRIG 121  CHOLHDL 6.5   No results for input(s): TSH, T4TOTAL, T3FREE, THYROIDAB in the last 72 hours.  Invalid input(s): FREET3 No results for input(s): VITAMINB12, FOLATE, FERRITIN, TIBC, IRON, RETICCTPCT in the last 72 hours. Coags: No results for input(s): INR in the last 72 hours.  Invalid input(s): PT Microbiology: No results found for this or any previous visit (from the past 240 hour(s)).   BRIEF HOSPITAL COURSE:   Principal Problem:   Atypical chest pain:atypical: more suggestive of GI etiology.  Seen by cards, no further work up indicated at this time. Cardiac enzymes negative. Started on Protonix with prn GI cocktail, no further chest pain this am. Stable for discharge.   Active Problems:   LBBB (left bundle branch block):likely chronic. Enzymes negative. Given advanced age, dementia, frailty-suspect further work up will not change management. Stable for discharge back to nursing facility.    GERD (gastroesophageal reflux disease):suspect causing chest pain. Started on PPI-follow closely    Mild dementia: suspect at baseline-pleasantly confused.    CKD (chronic kidney disease), stage MWN:UUVOZDG lytes in 1-2 weeks-no comparision-suspect chronic issue  TODAY-DAY OF DISCHARGE:  Subjective:   Jacob Hooper today has no headache,no chest abdominal pain,no new weakness tingling or numbness. Remains mildly but pleasantly confused.  Objective:   Blood pressure 133/68, pulse 52, temperature 98.7 F (37.1 C), temperature source Oral, resp. rate 16, height 5\' 3"  (1.6 m), weight 65.817 kg (145 lb 1.6 oz), SpO2 94 %.  Intake/Output Summary (Last 24 hours) at 10/10/14 1033 Last data filed at 10/10/14 0840  Gross per 24 hour  Intake    300 ml  Output  250 ml  Net     50 ml   Filed Weights   10/09/14 1756 10/10/14 0400  Weight: 65.817 kg (145 lb 1.6 oz) 65.817 kg (145 lb 1.6 oz)    Exam Awake and pleasantly confused.  No new F.N deficits, Normal affect [place].AT,PERRAL Supple Neck,No JVD, No cervical lymphadenopathy appriciated.  Symmetrical Chest wall movement, Good air movement bilaterally, CTAB RRR,No Gallops,Rubs or new Murmurs, No Parasternal Heave +ve B.Sounds, Abd Soft, Non tender, No organomegaly appriciated, No rebound -guarding or rigidity. No Cyanosis, Clubbing or edema, No new Rash or bruise  DISCHARGE CONDITION: Stable  DISPOSITION: SNF  DISCHARGE INSTRUCTIONS:    Activity:  As tolerated with Full fall precautions use walker/cane & assistance as  needed  Diet recommendation: Heart Healthy diet Aspiration precautions:yes  Discharge Instructions    Diet - low sodium heart healthy    Complete by:  As directed      Increase activity slowly    Complete by:  As directed            Follow-up Information    Follow up with GATES,ROBERT NEVILL, MD. Schedule an appointment as soon as possible for a visit in 1 week.   Specialty:  Internal Medicine   Contact information:   [address] [place] [address] [phone]       Total Time spent on discharge equals 25   Signed: [facility] 10/10/2014 10:33 AM

## 2014-10-10 NOTE — Care Management Note (Signed)
Case Management Note  Patient Details  Name: Jacob Hooper MRN: [id number] Date of Birth: 09/19/1915  Subjective/Objective:      Pt admitted for cp.               Action/Plan:Plan for SNF once medically stable for d/c. No further needs from CM.  Expected Discharge Date:  10/10/14               Expected Discharge Plan:  [place]  In-House Referral:  Clinical Social Work  Discharge planning Services  CM Consult  Status of Service:  Completed, signed off  Medicare Important Message Given:  N/A - LOS <3 / Initial given by admissions Date Medicare IM Given:    Medicare IM give by:    Date Additional Medicare IM Given:    Additional Medicare Important Message give by:     If discussed at [place] of Stay Meetings, dates discussed:    Additional Comments: Pt d/c to SNF.   Bethena Roys, RN 10/10/2014, 1:41 PM

## 2014-10-10 NOTE — Care Management Note (Signed)
Case Management Note  Patient Details  Name: Susumu Hackler MRN: [id number] Date of Birth: 12/01/1915  Subjective/Objective:                    Action/Plan:   Expected Discharge Date:  10/10/14               Expected Discharge Plan:  Skilled Nursing Facility  In-House Referral:  Clinical Social Work  Discharge planning Services  CM Consult  Post Acute Care Choice:    Choice offered to:     DME Arranged:    DME Agency:     HH Arranged:    [place] Agency:     Status of Service:  Completed, signed off  Medicare Important Message Given:  N/A - LOS <3 / Initial given by admissions Date Medicare IM Given:    Medicare IM give by:    Date Additional Medicare IM Given:    Additional Medicare Important Message give by:     If discussed at [place] of Stay Meetings, dates discussed:    Additional Comments:  Bethena Roys, RN 10/10/2014, 1:44 PM

## 2014-10-10 NOTE — Clinical Social Work Note (Signed)
Clinical Social Work Assessment  Patient Details  Name: Jacob Hooper MRN: [id number] Date of Birth: February 27, 1916  Date of referral:  10/10/14               Reason for consult:  Facility Placement                Permission sought to share information with:  Chartered certified accountant granted to share information::  Yes, Verbal Permission Granted   Housing/Transportation Living arrangements for the past 2 months:  [place] ([place]) Source of Information:  Facility, Adult Children Patient Interpreter Needed:  None Criminal Activity/Legal Involvement Pertinent to Current Situation/Hospitalization:  No - Comment as needed Significant Relationships:  Adult Children Lives with:  Facility Resident Do you feel safe going back to the place where you live?  Yes Need for family participation in patient care:  Yes (Comment)  Care giving concerns: No concerns identified   Facilities manager / plan:  CSW notified admitted from ALF. CSW spoke with Licensed conveyancer at [employer] who confirmed pt is a resident and can return today. Per Leigh, CSW will not need to complete FL2 since pt will be observation status. Facility did request CSW send dc summary. CSW updated pt daughter and confirmed she will provide transportation. Pt nurse aware. Pt has no further hospital social work needs. CSW signing off.  Insurance information:  Managed Care PT Recommendations:  Not assessed at this time  Patient/Family's Response to care:  Pt daughter glad pt able to return to ALF today  Patient/Family's Understanding of and Emotional Response to Diagnosis, Current Treatment, and Prognosis:  Pt daughter very informed on pt condition and asked appropriate questions. She expressed concern for testing/labs done yesterday. CSW allowed her to speak with pt nurse to answer questions.   Emotional Assessment Appearance:  Appears stated age,  Well-Groomed Attitude/Demeanor/Rapport:  Unable to Assess Affect (typically observed):  Unable to Assess Orientation:  Oriented to Self Alcohol / Substance use:  Not Applicable Psych involvement (Current and /or in the community):  No (Comment)  Discharge Needs  Concerns to be addressed:  Discharge Planning Concerns Readmission within the last 30 days:  No Current discharge risk:  None Barriers to Discharge:  No Barriers Identified   [place], [place]

## 2014-10-10 NOTE — Progress Notes (Signed)
UR Completed Carra Brindley Graves-Bigelow, RN,BSN [phone]  

## 2014-10-10 NOTE — Progress Notes (Signed)
Pt discharged via W/C, condition stable, accompanied by daughter.

## 2014-10-10 NOTE — Care Management Note (Deleted)
Case Management Note  Patient Details  Name: Jacob Hooper MRN: [id number] Date of Birth: [birth year]  Subjective/Objective:    Pt admitted for CP.                 Action/Plan: Plan will be for SNF once stable.   Expected Discharge Date:                 Expected Discharge Plan:     In-House Referral:     Discharge planning Services     Post Acute Care Choice:    Choice offered to:     DME Arranged:    DME Agency:     HH Arranged:    [place] Agency:     Status of Service:     Medicare Important Message Given:    Date Medicare IM Given:    Medicare IM give by:    Date Additional Medicare IM Given:    Additional Medicare Important Message give by:     If discussed at [place] of Stay Meetings, dates discussed:    Additional Comments:  Bethena Roys, RN 10/10/2014, 12:22 PM

## 2014-10-10 NOTE — Progress Notes (Signed)
Patient Name: Jacob Hooper Date of Encounter: 10/10/2014     Principal Problem:   Atypical chest pain Active Problems:   LBBB (left bundle branch block)   GERD (gastroesophageal reflux disease)   Mild dementia   CKD (chronic kidney disease), stage III   Chest pain    SUBJECTIVE  The patient has had no further chest discomfort since admission.  His cardiac enzymes are normal.  His EKG shows normal sinus rhythm with left bundle branch block.  No prior EKGs in epic to compare.  CURRENT MEDS . aspirin EC  325 mg Oral Daily  . enoxaparin (LOVENOX) injection  30 mg Subcutaneous Q24H  . pantoprazole  40 mg Oral Daily    OBJECTIVE  Filed Vitals:   10/09/14 1756 10/09/14 2000 10/10/14 0040 10/10/14 0400  BP: 160/67 133/81 118/47 134/68  Pulse: 66 70 61 60  Temp: 97.6 F (36.4 C) 98.3 F (36.8 C) 99.2 F (37.3 C) 97.8 F (36.6 C)  TempSrc: Oral Oral Oral Oral  Resp: 16 18 18 18   Height: 5\' 3"  (1.6 m)     Weight: 145 lb 1.6 oz (65.817 kg)   145 lb 1.6 oz (65.817 kg)  SpO2: 100% 95% 96% 96%    Intake/Output Summary (Last 24 hours) at 10/10/14 0800 Last data filed at 10/09/14 1809  Gross per 24 hour  Intake      0 ml  Output    250 ml  Net   -250 ml   Filed Weights   10/09/14 1756 10/10/14 0400  Weight: 145 lb 1.6 oz (65.817 kg) 145 lb 1.6 oz (65.817 kg)    PHYSICAL EXAM  General: Pleasant, NAD. Neuro: Alert, pleasant, cooperative.  Moves all extremities spontaneously. Psych: Normal affect. HEENT:  Normal  Neck: Supple without bruits or JVD. Lungs:  Resp regular and unlabored, CTA. Heart: RRR no s3, s4, or murmurs. Abdomen: Soft, non-tender, non-distended, BS + x 4.  Extremities: No clubbing, cyanosis or edema. DP/PT/Radials 2+ and equal bilaterally.  Accessory Clinical Findings  CBC  Recent Labs  10/09/14 1341  WBC 7.0  HGB 14.3  HCT 41.4  MCV 89.8  PLT 147   Basic Metabolic Panel  Recent Labs  10/09/14 1341  NA 141  K 4.6  CL 106    CO2 27  GLUCOSE 143*  BUN 19  CREATININE 1.67*  CALCIUM 9.3   Liver Function Tests No results for input(s): AST, ALT, ALKPHOS, BILITOT, PROT, ALBUMIN in the last 72 hours. No results for input(s): LIPASE, AMYLASE in the last 72 hours. Cardiac Enzymes  Recent Labs  10/09/14 2200 10/10/14 0321  TROPONINI <0.03 <0.03   BNP Invalid input(s): POCBNP D-Dimer No results for input(s): DDIMER in the last 72 hours. Hemoglobin A1C No results for input(s): HGBA1C in the last 72 hours. Fasting Lipid Panel  Recent Labs  10/10/14 0321  CHOL 221*  HDL 34*  LDLCALC 163*  TRIG 121  CHOLHDL 6.5   Thyroid Function Tests No results for input(s): TSH, T4TOTAL, T3FREE, THYROIDAB in the last 72 hours.  Invalid input(s): FREET3  TELE  Normal sinus rhythm  ECG  Normal sinus rhythm.  Left bundle branch block.  Radiology/Studies  Dg Chest 2 View  10/09/2014   CLINICAL DATA:  81 year old male with a history of general chest pain. History of stroke and coronary artery disease  EXAM: CHEST - 2 VIEW  COMPARISON:  10/28/2011, 10/29/2010, prior CT 04/18/2009  FINDINGS: Cardiomediastinal silhouette unchanged in size and contour. Tortuosity  of the thoracic aorta again noted. Atherosclerotic calcifications of the aortic arch.  Stigmata of emphysema, with increased retrosternal airspace, flattened hemidiaphragms, increased AP diameter, and hyperinflation on the AP view.  No confluent airspace disease. No pulmonary vascular congestion. No pneumothorax or pleural effusion.  Chronic lung markings.  No displaced fracture.  Degenerative changes of the spine.  Unremarkable appearance of the upper abdomen.  IMPRESSION: No radiographic evidence of acute cardiopulmonary disease with background chronic changes.  Atherosclerosis.  Emphysema.  Signed,  Dulcy Fanny. Earleen Newport, DO  [facility]  [facility]   Electronically Signed   By: Corrie Mckusick D.O.   On: 10/09/2014 14:45    US Abdomen Complete  10/09/2014   CLINICAL DATA:  Epigastric abdominal pain  EXAM: ULTRASOUND ABDOMEN COMPLETE  COMPARISON:  None.  FINDINGS: Gallbladder: Contracted gallbladder. The patient ate several hr prior to imaging. Sonographic Murphy's sign absent.  Common bile duct: Diameter: 4 mm  Liver: No focal lesion identified. Within normal limits in parenchymal echogenicity.  IVC: No abnormality visualized.  Pancreas: Not seen satisfactorily due to overlying bowel gas.  Spleen: Size and appearance within normal limits.  Right Kidney: Length: 9.9 cm. Cortical thinning and prominence of renal sinus adipose tissue.  Left Kidney: Length: 9.3 cm. Cortical thinning and prominence of renal sinus adipose tissue.  Abdominal aorta: No aneurysm visualized. Atherosclerotic calcification noted.  Other findings: None.  IMPRESSION: 1. Renal cortical atrophy, potentially with renal sinus lipomatosis. 2. Poor visualization of the pancreas and midline structures due to bowel gas. 3. Mildly contracted gallbladder due to patient meal several hr prior to imaging.   Electronically Signed   By: Van Clines M.D.   On: 10/09/2014 17:14    ASSESSMENT AND PLAN  1.  Atypical chest pain.  No evidence for myocardial damage.  This may have been angina pectoris but more likely GI in origin. 2.  Left bundle branch block 3.  Chronic kidney disease stage III    Recommendation: No further in-hospital cardiac testing indicated.  Continue conservative management.  From cardiac standpoint okay [facility]  Signed, Darlin Coco MD

## 2015-12-28 ENCOUNTER — Encounter: Payer: Self-pay | Admitting: Internal Medicine

## 2015-12-28 ENCOUNTER — Non-Acute Institutional Stay (SKILLED_NURSING_FACILITY): Payer: Medicare Other | Admitting: Internal Medicine

## 2015-12-28 DIAGNOSIS — F0390 Unspecified dementia without behavioral disturbance: Secondary | ICD-10-CM

## 2015-12-28 DIAGNOSIS — N183 Chronic kidney disease, stage 3 unspecified: Secondary | ICD-10-CM

## 2015-12-28 DIAGNOSIS — F03A0 Unspecified dementia, mild, without behavioral disturbance, psychotic disturbance, mood disturbance, and anxiety: Secondary | ICD-10-CM

## 2015-12-28 DIAGNOSIS — K219 Gastro-esophageal reflux disease without esophagitis: Secondary | ICD-10-CM | POA: Diagnosis not present

## 2015-12-28 NOTE — Progress Notes (Signed)
Patient ID: Jacob Hooper, male   DOB: [birth year], 82 y.o.   MRN: [id number]   Progress Note     Location:   [place] Room Number: N29 Place of Service:  SNF (31)  PCP: Jeanmarie Hubert, MD Patient Care Team: Estill Dooms, MD as PCP - General (Internal Medicine) Man Otho Darner, NP as Nurse Practitioner (Internal Medicine) Rutherford Guys, MD as Consulting Physician (Ophthalmology) Vicie Mutters, MD as Consulting Physician (Otolaryngology)  Extended Emergency Contact Information Primary Emergency Contact: Doylene Canard [place] of [place] Phone: [phone] Relation: Daughter Secondary Emergency Contact: Leana Gamer [place] of [place] Phone: [phone] Relation: Other  Code Status:NCB Goals of Care: Advanced Directive information Advanced Directives 12/28/2015  Does patient have an advance directive? Yes  Type of Advance Directive Living will;Healthcare Power of [place];Out of facility DNR (pink MOST or yellow form)  Does patient want to make changes to advanced directive? -  Copy of advanced directive(s) in chart? Yes  Pre-existing out of facility DNR order (yellow form or pink MOST form) Yellow form placed in chart (order not valid for inpatient use)      Chief Complaint  Patient presents with  . New Admit To SNF    transfer from AL to skill due to increase need of assist with ADL's     HPI: Patient is a 82 y.o. male seen today for admission to  [facility] SN[facility] due to increasing dependency on staff for assistance. Patient previously lived in the assisted living area. He has increasing dementia. He has hadepisodes, in the past, of chest discomfort diagnosed as atypical angina. He denies any recent problems with this.  Past Medical History  Diagnosis Date  . Stroke [facility]) 2004  . Coronary artery disease 02/1994  . Dementia   . GERD (gastroesophageal reflux disease)   . Hypercholesteremia   . Vertigo   . Prostate cancer  [facility]) 1993    prostatectomy   . Pneumonia 08/1993  . Right knee DJD   . Hearing loss     w/high frequency ringing-Dr. Vicie Mutters   Past Surgical History  Procedure Laterality Date  . Prostatectomy  1993  . Appendectomy    . Tonsillectomy    . Breast biopsy      for gynecomastia, benign   . Antrotomy      4 opaque sinuses  . Inguinal hernia repair Left     Dr. Benard Rink    reports that he has never smoked. He has never used smokeless tobacco. He reports that he does not drink alcohol or use illicit drugs. Social History   Social History  . Marital Status: Widowed    Spouse Name: N/A  . Number of Children: N/A  . Years of Education: N/A   Occupational History  . retired Biomedical scientist    Social History Main Topics  . Smoking status: Never Smoker   . Smokeless tobacco: Never Used  . Alcohol Use: No  . Drug Use: No  . Sexual Activity: No   Other Topics Concern  . Not on file   Social History Narrative   Lives at [facility] transfer to skill unit 12/18/2015   Widowed   Never smoked   Alcohol none     Family History  Problem Relation Age of Onset  . Prostate cancer Father   . Prostate cancer Brother     Health Maintenance  Topic Date Due  . Samul Dada  2019-07-2536  . ZOSTAVAX  10-13-201979  . PNA vac Low Risk Adult (1 of 2 - PCV13) 10-13-201984  . INFLUENZA VACCINE  01/09/2016    Allergies  Allergen Reactions  . Aspirin     High doses causes easy bruising      Medication List       This list is accurate as of: 12/28/15 11:54 AM.  Always use your most recent med list.               acetaminophen 325 MG tablet  Commonly known as:  TYLENOL  Take 650 mg by mouth 3 (three) times daily as needed for mild pain.     aspirin 81 MG EC tablet  Take 1 tablet (81 mg total) by mouth daily.     CERTAVITE SENIOR/ANTIOXIDANT Tabs  Take 1 tablet by mouth every evening.     famotidine 20 MG tablet  Commonly known as:  PEPCID  Take 20 mg by  mouth. Take one tablet at bedtime     gi cocktail Susp suspension  Take 30 mLs by mouth 4 (four) times daily as needed for indigestion (or chest pain). Shake well.     nitroGLYCERIN 0.4 MG SL tablet  Commonly known as:  NITROSTAT  Place 0.4 mg under the tongue every 5 (five) minutes as needed for chest pain.     polyethylene glycol packet  Commonly known as:  MIRALAX / GLYCOLAX  Take 17 g by mouth every Monday, Wednesday, and Friday.     senna-docusate 8.6-50 MG tablet  Commonly known as:  Senokot-S  Take 1-2 tablets by mouth every evening. Take 2 if not effective        Review of Systems  Constitutional: Positive for fatigue. Negative for activity change, appetite change, fever and unexpected weight change.  HENT: Positive for hearing loss. Negative for congestion, ear pain, rhinorrhea, sore throat, tinnitus, trouble swallowing and voice change.   Eyes:       Corrective lenses  Respiratory: Negative for cough, choking, chest tightness, shortness of breath and wheezing.   Cardiovascular: Negative for chest pain, palpitations and leg swelling.       History atypical angina. Left bundle branch block.  Gastrointestinal: Negative for abdominal distention, abdominal pain, constipation, diarrhea and nausea.  Endocrine: Negative for cold intolerance, heat intolerance, polydipsia, polyphagia and polyuria.  Genitourinary: Negative for dysuria, frequency, testicular pain and urgency.       Not incontinent  Musculoskeletal: Negative for arthralgias, back pain, gait problem, myalgias and neck pain.  Skin: Negative for color change, pallor and rash.  Allergic/Immunologic: Negative.   Neurological: Negative for dizziness, tremors, syncope, speech difficulty, weakness, numbness and headaches.       Progressive dementia  Hematological: Negative for adenopathy. Does not bruise/bleed easily.  Psychiatric/Behavioral: Positive for confusion and decreased concentration. Negative for behavioral  problems, hallucinations and sleep disturbance. The patient is not nervous/anxious.     Filed Vitals:   12/28/15 1129  BP: 130/70  Pulse: 70  Temp: 97.3 F (36.3 C)  Resp: 20  Height: 5\' 5"  (1.651 m)  Weight: 141 lb (63.957 kg)   Body mass index is 23.46 kg/(m^2). Physical Exam  Constitutional: He is oriented to person, place, and time. He appears well-developed and well-nourished. No distress.  Lethargic  HENT:  Right Ear: External ear normal.  Left Ear: External ear normal.  Nose: Nose normal.  Mouth/Throat: Oropharynx is clear and moist. No oropharyngeal exudate.  Bilateral hearing loss  Eyes: Conjunctivae and EOM are normal. Pupils are  equal, round, and reactive to light.  Neck: No JVD present. No tracheal deviation present. No thyromegaly present.  Cardiovascular: Normal rate, regular rhythm, normal heart sounds and intact distal pulses.  Exam reveals no gallop and no friction rub.   No murmur heard. Pulmonary/Chest: No respiratory distress. He has no wheezes. He has no rales. He exhibits no tenderness.  Abdominal: He exhibits no distension and no mass. There is no tenderness.  Musculoskeletal: Normal range of motion. He exhibits no edema or tenderness.  Lymphadenopathy:    He has no cervical adenopathy.  Neurological: He is alert and oriented to person, place, and time. He has normal reflexes. No cranial nerve deficit. Coordination normal.  Skin: No rash noted. No erythema. No pallor.  Psychiatric: He has a normal mood and affect. His behavior is normal. Judgment and thought content normal.    Labs reviewed:  Imaging and Procedures obtained prior to SNF admission: Dg Chest 2 View  10/09/2014  CLINICAL DATA:  82 year old male with a history of general chest pain. History of stroke and coronary artery disease EXAM: CHEST - 2 VIEW COMPARISON:  10/28/2011, 10/29/2010, prior CT 04/18/2009 FINDINGS: Cardiomediastinal silhouette unchanged in size and contour. Tortuosity of the  thoracic aorta again noted. Atherosclerotic calcifications of the aortic arch. Stigmata of emphysema, with increased retrosternal airspace, flattened hemidiaphragms, increased AP diameter, and hyperinflation on the AP view. No confluent airspace disease. No pulmonary vascular congestion. No pneumothorax or pleural effusion. Chronic lung markings. No displaced fracture.  Degenerative changes of the spine. Unremarkable appearance of the upper abdomen. IMPRESSION: No radiographic evidence of acute cardiopulmonary disease with background chronic changes. Atherosclerosis. Emphysema. Signed, Dulcy Fanny. Earleen Newport, DO [facility] Electronically Signed   By: Corrie Mckusick D.O.   On: 10/09/2014 14:45   US Abdomen Complete  10/09/2014  CLINICAL DATA:  Epigastric abdominal pain EXAM: ULTRASOUND ABDOMEN COMPLETE COMPARISON:  None. FINDINGS: Gallbladder: Contracted gallbladder. The patient ate several hr prior to imaging. Sonographic Murphy's sign absent. Common bile duct: Diameter: 4 mm Liver: No focal lesion identified. Within normal limits in parenchymal echogenicity. IVC: No abnormality visualized. Pancreas: Not seen satisfactorily due to overlying bowel gas. Spleen: Size and appearance within normal limits. Right Kidney: Length: 9.9 cm. Cortical thinning and prominence of renal sinus adipose tissue. Left Kidney: Length: 9.3 cm. Cortical thinning and prominence of renal sinus adipose tissue. Abdominal aorta: No aneurysm visualized. Atherosclerotic calcification noted. Other findings: None. IMPRESSION: 1. Renal cortical atrophy, potentially with renal sinus lipomatosis. 2. Poor visualization of the pancreas and midline structures due to bowel gas. 3. Mildly contracted gallbladder due to patient meal several hr prior to imaging. Electronically Signed   By: Van Clines M.D.   On: 10/09/2014 17:14    Assessment/Plan 1. Mild dementia  Not medicated at present.  Continue to monitor.  2. Gastroesophageal reflux disease without esophagitis Asymptomatic  3. CKD (chronic kidney disease), stage III  stable on lab from May 2017. Follow-up lab periodically

## 2016-01-02 LAB — CBC AND DIFFERENTIAL
HCT: 36 % — AB (ref 41–53)
HEMOGLOBIN: 12.4 g/dL — AB (ref 13.5–17.5)
Platelets: 221 10*3/uL (ref 150–399)
WBC: 6 10*3/mL

## 2016-01-02 LAB — TSH: TSH: 2.01 u[IU]/mL (ref <=5.90)

## 2016-01-02 LAB — HEPATIC FUNCTION PANEL
ALT: 10 U/L (ref 10–40)
AST: 15 U/L (ref 14–40)
Alkaline Phosphatase: 96 U/L (ref 25–125)
BILIRUBIN, TOTAL: 0.6 mg/dL

## 2016-01-02 LAB — BASIC METABOLIC PANEL
BUN: 21 mg/dL (ref 4–21)
Creatinine: 1.4 mg/dL — AB (ref <=1.3)
Glucose: 77 mg/dL
Potassium: 4.6 mmol/L (ref 3.4–5.3)
SODIUM: 140 mmol/L (ref 137–147)

## 2016-01-04 ENCOUNTER — Other Ambulatory Visit: Payer: Self-pay | Admitting: *Deleted

## 2016-01-09 LAB — LIPID PANEL
Cholesterol: 190 mg/dL (ref 0–200)
HDL: 39 mg/dL (ref 35–70)
LDL Cholesterol: 124 mg/dL
LDl/HDL Ratio: 4.9
Triglycerides: 135 mg/dL (ref 40–160)

## 2016-02-05 ENCOUNTER — Non-Acute Institutional Stay (SKILLED_NURSING_FACILITY): Payer: Medicare Other | Admitting: Nurse Practitioner

## 2016-02-05 ENCOUNTER — Encounter: Payer: Self-pay | Admitting: Nurse Practitioner

## 2016-02-05 DIAGNOSIS — R0789 Other chest pain: Secondary | ICD-10-CM

## 2016-02-05 DIAGNOSIS — F0390 Unspecified dementia without behavioral disturbance: Secondary | ICD-10-CM

## 2016-02-05 DIAGNOSIS — K219 Gastro-esophageal reflux disease without esophagitis: Secondary | ICD-10-CM | POA: Diagnosis not present

## 2016-02-05 DIAGNOSIS — N183 Chronic kidney disease, stage 3 unspecified: Secondary | ICD-10-CM

## 2016-02-05 DIAGNOSIS — F03A0 Unspecified dementia, mild, without behavioral disturbance, psychotic disturbance, mood disturbance, and anxiety: Secondary | ICD-10-CM

## 2016-02-05 NOTE — Assessment & Plan Note (Signed)
01/02/16 Hgb 12.4, Na 140, K 4.6, Bun 21, creat 1.36, TSH 2.01

## 2016-02-05 NOTE — Assessment & Plan Note (Signed)
Prn GI cocktail available to him.

## 2016-02-05 NOTE — Progress Notes (Signed)
Location:   [place] Room Number: 37 Place of Service:  SNF (31) Provider:  Percy Winterrowd, Manxie  NP  Jeanmarie Hubert, MD  Patient Care Team: Estill Dooms, MD as PCP - General (Internal Medicine) Charma Mocarski Otho Darner, NP as Nurse Practitioner (Internal Medicine) Rutherford Guys, MD as Consulting Physician (Ophthalmology) Vicie Mutters, MD as Consulting Physician (Otolaryngology)  Extended Emergency Contact Information Primary Emergency Contact: Doylene Canard [place] of [place] Phone: [phone] Relation: Daughter Secondary Emergency Contact: Leana Gamer [place] of [place] Phone: [phone] Relation: Other  Code Status:  DNR Goals of care: Advanced Directive information Advanced Directives 02/05/2016  Does patient have an advance directive? Yes  Type of Advance Directive Living will;Out of facility DNR (pink MOST or yellow form)  Does patient want to make changes to advanced directive? No - Patient declined  Copy of advanced directive(s) in chart? Yes  Pre-existing out of facility DNR order (yellow form or pink MOST form) -     Chief Complaint  Patient presents with  . Medical Management of Chronic Issues    HPI:  Pt is a 82 y.o. male seen today for medical management of chronic diseases.    Hx of CAD, no angina since SNF admission, prn NTG available to him. GERD is stable, prn GI cocktail, OA multiple sites, prn Tylenol, CKD last creat 1.4 01/02/16  Past Medical History:  Diagnosis Date  . Coronary artery disease 02/1994  . Dementia   . GERD (gastroesophageal reflux disease)   . Hearing loss    w/high frequency ringing-Dr. Vicie Mutters  . Hypercholesteremia   . Pneumonia 08/1993  . Prostate cancer [facility]) 1993   prostatectomy   . Right knee DJD   . Stroke [facility]) 2004  . Vertigo    Past Surgical History:  Procedure Laterality Date  . antrotomy     4 opaque sinuses  . APPENDECTOMY    . BREAST BIOPSY     for gynecomastia, benign    . INGUINAL HERNIA REPAIR Left    Dr. Benard Rink  . PROSTATECTOMY  1993  . TONSILLECTOMY      Allergies  Allergen Reactions  . Aspirin     High doses causes easy bruising      Medication List       Accurate as of 02/05/16  4:57 PM. Always use your most recent med list.          acetaminophen 325 MG tablet Commonly known as:  TYLENOL Take 650 mg by mouth 3 (three) times daily as needed for mild pain.   gi cocktail Susp suspension Take 30 mLs by mouth 4 (four) times daily as needed for indigestion (or chest pain). Shake well.   nitroGLYCERIN 0.4 MG SL tablet Commonly known as:  NITROSTAT Place 0.4 mg under the tongue every 5 (five) minutes as needed for chest pain.       Review of Systems  Immunization History  Administered Date(s) Administered  . Influenza-Unspecified 02/28/2015  . Pneumococcal Conjugate-13 04/18/2014   Pertinent  Health Maintenance Due  Topic Date Due  . PNA vac Low Risk Adult (2 of 2 - PPSV23) 04/19/2015  . INFLUENZA VACCINE  01/09/2016   No flowsheet data found. Functional Status Survey:    Vitals:   02/05/16 1236  BP: 124/64  Pulse: 70  Resp: 20  Temp: 97.6 F (36.4 C)  Weight: 145 lb 9.6 oz (66 kg)  Height: 5\' 5"  (1.651 m)   Body mass  index is 24.23 kg/m. Physical Exam  Labs reviewed:  Recent Labs  01/02/16  NA 140  K 4.6  BUN 21  CREATININE 1.4*    Recent Labs  01/02/16  AST 15  ALT 10  ALKPHOS 96    Recent Labs  01/02/16  WBC 6.0  HGB 12.4*  HCT 36*  PLT 221   Lab Results  Component Value Date   TSH 2.01 01/02/2016   No results found for: HGBA1C Lab Results  Component Value Date   CHOL 221 (H) 10/10/2014   HDL 34 (L) 10/10/2014   LDLCALC 163 (H) 10/10/2014   TRIG 121 10/10/2014   CHOLHDL 6.5 10/10/2014    Significant Diagnostic Results in last 30 days:  No results found.  Assessment/Plan There are no diagnoses linked to this encounter.CKD (chronic kidney disease), stage III 01/02/16  Hgb 12.4, Na 140, K 4.6, Bun 21, creat 1.36, TSH 2.01    Atypical chest pain No angina since SNF admission, prn NTG available to him  Mild dementia Requires SNF for care assistance.   GERD (gastroesophageal reflux disease) Prn GI cocktail available to him.      Family/ staff Communication: continue SNF for care assistance.   Labs/tests ordered:  none

## 2016-02-05 NOTE — Assessment & Plan Note (Signed)
No angina since SNF admission, prn NTG available to him

## 2016-02-05 NOTE — Assessment & Plan Note (Signed)
Requires SNF for care assistance.

## 2016-02-27 IMAGING — DX DG CHEST 2V
2 series · 2 of 2 positions shown · non-contrast
Comparison: 10/28/2011, 10/29/2010, prior CT 04/18/2009

CLINICAL DATA: [AGE] male with a history of general chest
pain. History of stroke and coronary artery disease

EXAM:
CHEST - 2 VIEW

[chest pa]
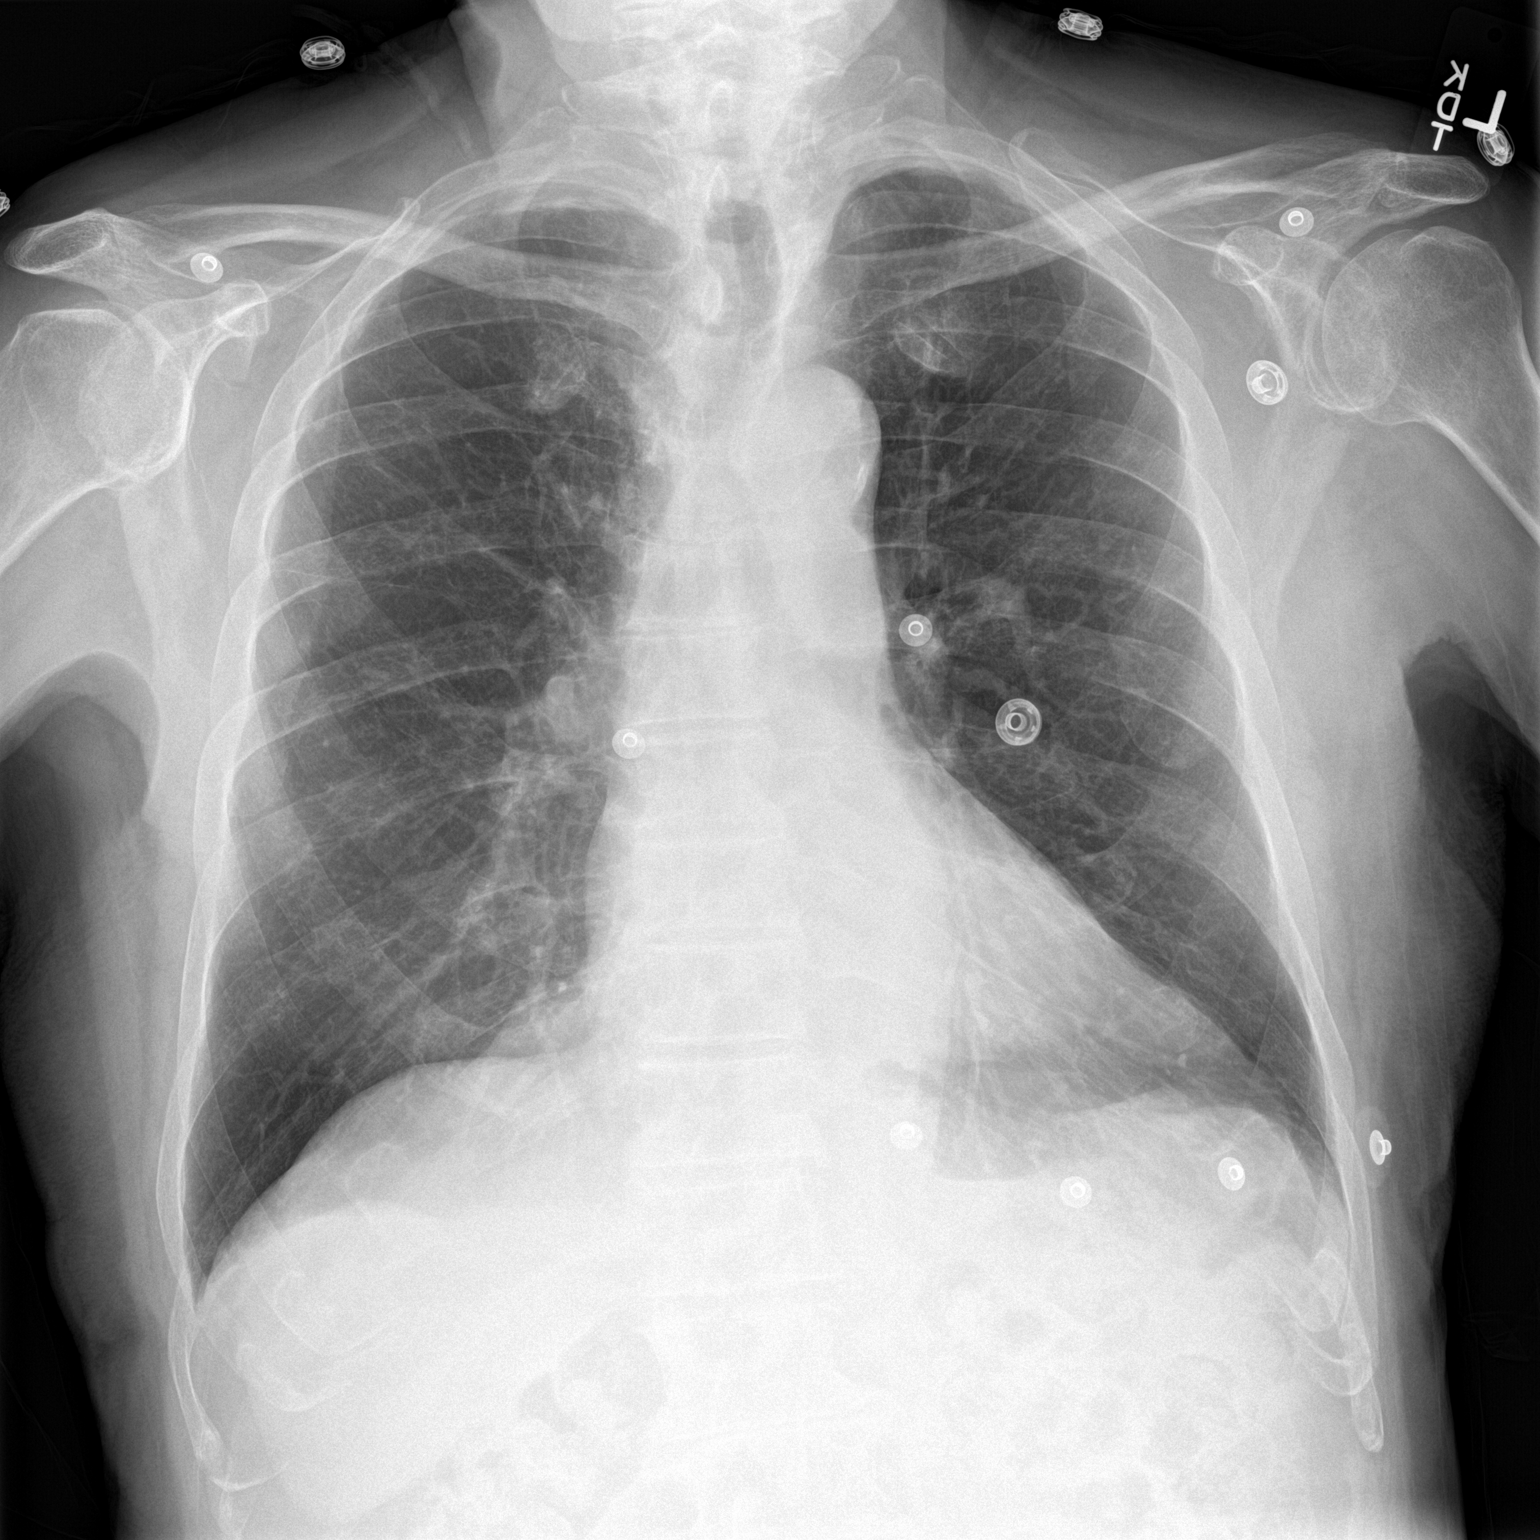

[chest lat]
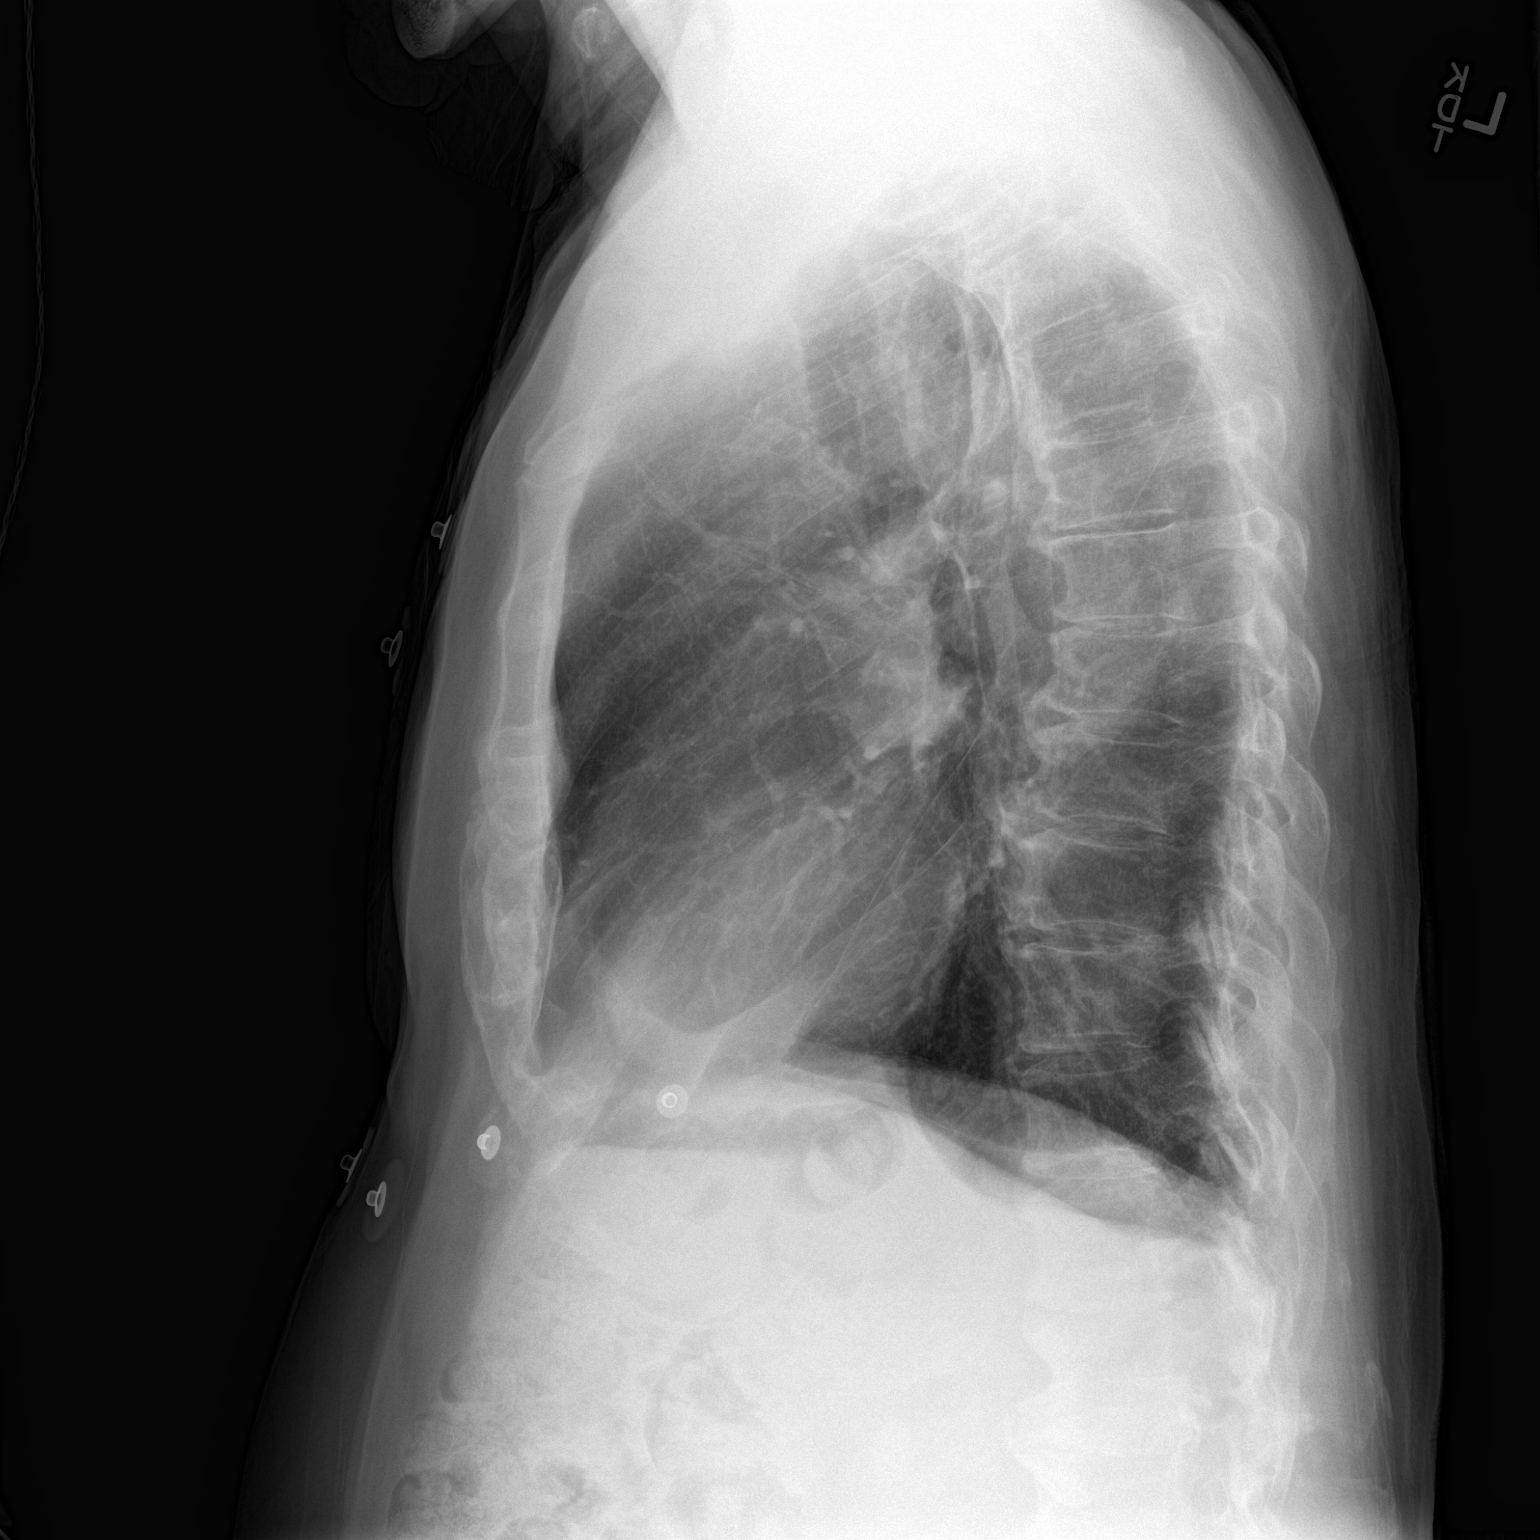

[2 of 2 positions shown; findings below may reference images not displayed]

FINDINGS: Cardiomediastinal silhouette unchanged in size and contour.
Tortuosity of the thoracic aorta again noted. Atherosclerotic
calcifications of the aortic arch.

Stigmata of emphysema, with increased retrosternal airspace,
flattened hemidiaphragms, increased AP diameter, and hyperinflation
on the AP view.

No confluent airspace disease. No pulmonary vascular congestion. No
pneumothorax or pleural effusion.

Chronic lung markings.

No displaced fracture.  Degenerative changes of the spine.

Unremarkable appearance of the upper abdomen.
IMPRESSION: No radiographic evidence of acute cardiopulmonary disease with
background chronic changes.

Atherosclerosis.

Emphysema.

## 2016-03-11 ENCOUNTER — Encounter: Payer: Self-pay | Admitting: Nurse Practitioner

## 2016-03-11 ENCOUNTER — Non-Acute Institutional Stay (SKILLED_NURSING_FACILITY): Payer: Medicare Other | Admitting: Nurse Practitioner

## 2016-03-11 DIAGNOSIS — F03A0 Unspecified dementia, mild, without behavioral disturbance, psychotic disturbance, mood disturbance, and anxiety: Secondary | ICD-10-CM

## 2016-03-11 DIAGNOSIS — F0390 Unspecified dementia without behavioral disturbance: Secondary | ICD-10-CM | POA: Diagnosis not present

## 2016-03-11 DIAGNOSIS — K2100 Gastro-esophageal reflux disease with esophagitis, without bleeding: Secondary | ICD-10-CM

## 2016-03-11 DIAGNOSIS — W19XXXA Unspecified fall, initial encounter: Secondary | ICD-10-CM | POA: Diagnosis not present

## 2016-03-11 DIAGNOSIS — R001 Bradycardia, unspecified: Secondary | ICD-10-CM | POA: Diagnosis not present

## 2016-03-11 NOTE — Progress Notes (Signed)
Location:  [place] Room Number: 28 Place of Service:  SNF (31) Provider:  Harout Scheurich, Manxie  NP  Jeanmarie Hubert, MD  Patient Care Team: Estill Dooms, MD as PCP - General (Internal Medicine) Jahna Liebert Otho Darner, NP as Nurse Practitioner (Internal Medicine) Rutherford Guys, MD as Consulting Physician (Ophthalmology) Vicie Mutters, MD as Consulting Physician (Otolaryngology)  Extended Emergency Contact Information Primary Emergency Contact: Doylene Canard States of [place] Phone: [phone] Relation: Daughter Secondary Emergency Contact: Leana Gamer [place] of [place] Phone: [phone] Relation: Other  Code Status:  DNR Goals of care: Advanced Directive information Advanced Directives 03/11/2016  Does patient have an advance directive? Yes  Type of Advance Directive Living will;Out of facility DNR (pink MOST or yellow form)  Does patient want to make changes to advanced directive? No - Patient declined  Copy of advanced directive(s) in chart? Yes  Pre-existing out of facility DNR order (yellow form or pink MOST form) -     Chief Complaint  Patient presents with  . Acute Visit    fell x2, abrasions on knees and left elbow.    HPI:  Pt is a 82 y.o. male seen today for an acute visit for fell x2, abrasion knees and left elbow, no decreased ROM, still able to ambulate and weight bearing   Hx of CAD, no angina since SNF admission, prn NTG available to him. GERD is stable, prn GI cocktail, OA multiple sites, prn Tylenol, CKD last creat 1.4 01/02/16    Past Medical History:  Diagnosis Date  . Coronary artery disease 02/1994  . Dementia   . GERD (gastroesophageal reflux disease)   . Hearing loss    w/high frequency ringing-Dr. Vicie Mutters  . Hypercholesteremia   . Pneumonia 08/1993  . Prostate cancer [facility]) 1993   prostatectomy   . Right knee DJD   . Stroke [facility]) 2004  . Vertigo    Past Surgical History:  Procedure Laterality  Date  . antrotomy     4 opaque sinuses  . APPENDECTOMY    . BREAST BIOPSY     for gynecomastia, benign   . INGUINAL HERNIA REPAIR Left    Dr. Benard Rink  . PROSTATECTOMY  1993  . TONSILLECTOMY      Allergies  Allergen Reactions  . Aspirin     High doses causes easy bruising      Medication List       Accurate as of 03/11/16  4:33 PM. Always use your most recent med list.          acetaminophen 325 MG tablet Commonly known as:  TYLENOL Take 650 mg by mouth 3 (three) times daily as needed for mild pain.   gi cocktail Susp suspension Take 30 mLs by mouth 4 (four) times daily as needed for indigestion (or chest pain). Shake well.   hydrocortisone cream 1 % Apply 1 application topically daily as needed for itching.   nitroGLYCERIN 0.4 MG SL tablet Commonly known as:  NITROSTAT Place 0.4 mg under the tongue every 5 (five) minutes as needed for chest pain.   sennosides-docusate sodium 8.6-50 MG tablet Commonly known as:  SENOKOT-S Take 2 tablets by mouth at bedtime as needed for constipation. For constipation       Review of Systems  Constitutional: Positive for fatigue. Negative for activity change, appetite change, fever and unexpected weight change.  HENT: Positive for hearing loss. Negative for congestion, ear pain, rhinorrhea, sore throat, tinnitus, trouble  swallowing and voice change.   Eyes:       Corrective lenses  Respiratory: Negative for cough, choking, chest tightness, shortness of breath and wheezing.   Cardiovascular: Negative for chest pain, palpitations and leg swelling.       History atypical angina. Left bundle branch block.  Gastrointestinal: Negative for abdominal distention, abdominal pain, constipation, diarrhea and nausea.  Endocrine: Negative for cold intolerance, heat intolerance, polydipsia, polyphagia and polyuria.  Genitourinary: Negative for dysuria, frequency, testicular pain and urgency.       Not incontinent  Musculoskeletal:  Negative for arthralgias, back pain, gait problem, myalgias and neck pain.  Skin: Negative for color change, pallor and rash.       Abrasion knees and left elbow  Allergic/Immunologic: Negative.   Neurological: Negative for dizziness, tremors, syncope, speech difficulty, weakness, numbness and headaches.       Progressive dementia  Hematological: Negative for adenopathy. Does not bruise/bleed easily.  Psychiatric/Behavioral: Positive for confusion and decreased concentration. Negative for behavioral problems, hallucinations and sleep disturbance. The patient is not nervous/anxious.     Immunization History  Administered Date(s) Administered  . Influenza-Unspecified 02/28/2015  . Pneumococcal Conjugate-13 04/18/2014   Pertinent  Health Maintenance Due  Topic Date Due  . PNA vac Low Risk Adult (2 of 2 - PPSV23) 04/19/2015  . INFLUENZA VACCINE  01/09/2016   No flowsheet data found. Functional Status Survey:    Vitals:   03/11/16 1117  BP: 122/62  Pulse: 68  Resp: 18  Temp: 97.6 F (36.4 C)  Weight: 145 lb 3.2 oz (65.9 kg)  Height: 5\' 5"  (1.651 m)   Body mass index is 24.16 kg/m. Physical Exam  Constitutional: He is oriented to person, place, and time. He appears well-developed and well-nourished. No distress.  Lethargic  HENT:  Right Ear: External ear normal.  Left Ear: External ear normal.  Nose: Nose normal.  Mouth/Throat: Oropharynx is clear and moist. No oropharyngeal exudate.  Bilateral hearing loss  Eyes: Conjunctivae and EOM are normal. Pupils are equal, round, and reactive to light.  Neck: No JVD present. No tracheal deviation present. No thyromegaly present.  Cardiovascular: Regular rhythm, normal heart sounds and intact distal pulses.  Exam reveals no gallop and no friction rub.   No murmur heard. Pulmonary/Chest: No respiratory distress. He has no wheezes. He has no rales. He exhibits no tenderness.  Abdominal: He exhibits no distension and no mass. There is  no tenderness.  Musculoskeletal: Normal range of motion. He exhibits no edema or tenderness.  Lymphadenopathy:    He has no cervical adenopathy.  Neurological: He is alert and oriented to person, place, and time. He has normal reflexes. No cranial nerve deficit. Coordination normal.  Skin: No rash noted. No erythema. No pallor.  Abrasion knees and left elbow.   Psychiatric: He has a normal mood and affect. His behavior is normal. Judgment and thought content normal.    Labs reviewed:  Recent Labs  01/02/16  NA 140  K 4.6  BUN 21  CREATININE 1.4*    Recent Labs  01/02/16  AST 15  ALT 10  ALKPHOS 96    Recent Labs  01/02/16  WBC 6.0  HGB 12.4*  HCT 36*  PLT 221   Lab Results  Component Value Date   TSH 2.01 01/02/2016   No results found for: HGBA1C Lab Results  Component Value Date   CHOL 190 01/09/2016   HDL 39 01/09/2016   LDLCALC 124 01/09/2016   TRIG  135 01/09/2016   CHOLHDL 6.5 10/10/2014    Significant Diagnostic Results in last 30 days:  No results found.  Assessment/Plan Fall Sustained skin abrasion knees and left elbow, lack of safety awareness related to dementia and increased frailty contributory to his falling, obtain CBC, CMP, UA C/S to evaluate further, intensive supervision for safety.   Mild dementia Requires SNF for care assistance.    GERD (gastroesophageal reflux disease) Prn GI cocktail available to him.    Bradycardia HR 40s, may consider ED eval. Dr Nyoka Cowden advised to obtain EKG 1st,      Family/ staff Communication: continue SNF for care assistance.   Labs/tests ordered:  CBC, CMP, UA C/S, EKG

## 2016-03-11 NOTE — Assessment & Plan Note (Signed)
HR 40s, may consider ED eval. Dr Nyoka Cowden advised to obtain EKG 1st,

## 2016-03-11 NOTE — Assessment & Plan Note (Signed)
Prn GI cocktail available to him.

## 2016-03-11 NOTE — Assessment & Plan Note (Signed)
Requires SNF for care assistance.

## 2016-03-11 NOTE — Assessment & Plan Note (Signed)
Sustained skin abrasion knees and left elbow, lack of safety awareness related to dementia and increased frailty contributory to his falling, obtain CBC, CMP, UA C/S to evaluate further, intensive supervision for safety.

## 2016-04-10 DEATH — deceased
